# Patient Record
Sex: Female | Born: 1995 | Race: Black or African American | Hispanic: No | Marital: Single | Smoking: Never smoker
Health system: Southern US, Community
[De-identification: ages and names within clinical notes are randomized; demographics above are authoritative.]

## PROBLEM LIST (undated history)

## (undated) DIAGNOSIS — F32A Depression, unspecified: Secondary | ICD-10-CM

## (undated) DIAGNOSIS — F419 Anxiety disorder, unspecified: Secondary | ICD-10-CM

## (undated) DIAGNOSIS — F4310 Post-traumatic stress disorder, unspecified: Secondary | ICD-10-CM

## (undated) HISTORY — PX: TONSILLECTOMY: SUR1361

---
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
## 2014-03-28 ENCOUNTER — Encounter (HOSPITAL_COMMUNITY): Payer: Self-pay | Admitting: Emergency Medicine

## 2014-03-28 ENCOUNTER — Emergency Department (HOSPITAL_COMMUNITY)
Admission: EM | Admit: 2014-03-28 | Discharge: 2014-03-28 | Disposition: A | Discharge status: Home / Self Care | Payer: Self-pay | Attending: Emergency Medicine | Admitting: Emergency Medicine

## 2014-03-28 DIAGNOSIS — Z7952 Long term (current) use of systemic steroids: Secondary | ICD-10-CM | POA: Insufficient documentation

## 2014-03-28 DIAGNOSIS — B349 Viral infection, unspecified: Secondary | ICD-10-CM | POA: Insufficient documentation

## 2014-03-28 LAB — RAPID STREP SCREEN (MED CTR MEBANE ONLY): STREPTOCOCCUS, GROUP A SCREEN (DIRECT): NEGATIVE

## 2014-03-28 MED ORDER — PSEUDOEPHEDRINE HCL 60 MG PO TABS: 60.0000 mg | ORAL_TABLET | Freq: Four times a day (QID) | ORAL | Status: AC | PRN

## 2014-03-28 NOTE — Discharge Instructions (Signed)

## 2014-03-28 NOTE — ED Provider Notes (Signed)
CSN: [id number]     Arrival date & time 03/28/14  1407 History  This chart was scribed for non-physician practitioner, Mariah SilkHannah Manuela Halbur, PA-C,working with Mariah JesterKathleen McManus, DO, by Mariah Bradley, ED Scribe. This patient was seen in room TR08C/TR08C and the patient's care was started at 3:10 PM.  Chief Complaint  Patient presents with  . Sore Throat  . Otalgia   Patient is a 18 y.o. female presenting with pharyngitis and ear pain. [name]. No language interpreter was used.  Sore Throat Pertinent negatives include no abdominal pain.  Otalgia Associated symptoms: sore throat   Associated symptoms: no abdominal pain, no fever and no vomiting    HPI Comments:  Mariah Bradley is a 18 y.o. female who presents to the Emergency Department complaining of bilateral otalgia and a sore throat that began two days ago. She reports associated subjective fever. She states that she has been experiencing nausea and diarrhea about 6 days ago but it has since resolved. Pt has not done anything to treat her symptoms. She denies any alleviating or worsening factors of her symptoms. Denies abdominal pain, vomiting, chills or body aches.  History reviewed. No pertinent past medical history. History reviewed. No pertinent past surgical history. History reviewed. No pertinent family history. History  Substance Use Topics  . Smoking status: Never Smoker   . Smokeless tobacco: Not on file  . Alcohol Use: No   OB History   Grav Para Term Preterm Abortions TAB SAB Ect Mult Living                 Review of Systems  Constitutional: Negative for fever and chills.  HENT: Positive for ear pain and sore throat.   Gastrointestinal: Negative for nausea, vomiting and abdominal pain.  All other systems reviewed and are negative.   Allergies  Calamine  Home Medications   Prior to Admission medications   Medication Sig Start Date End Date Taking? Authorizing Provider   DiphenhydrAMINE HCl (BENADRYL ALLERGY PO) Take by mouth.    Historical Provider, MD  hydrocortisone cream 0.5 % Apply topically 2 (two) times daily.    Historical Provider, MD  hydrOXYzine (ATARAX/VISTARIL) 25 MG tablet Take 1 tablet (25 mg total) by mouth every 6 (six) hours as needed for itching. 11/30/12   Mariah Likesavid C Keller, MD  predniSONE (DELTASONE) 20 MG tablet 3 daily for 5 days, 2 daily for 5 days, 1 daily for 5 days. 11/30/12   Mariah Likesavid C Keller, MD  triamcinolone cream (KENALOG) 0.1 % Apply topically 3 (three) times daily. 11/30/12   Mariah Likesavid C Keller, MD   Triage Vitals: BP 104/74  Pulse 86  Temp(Src) 98.8 F (37.1 C) (Oral)  Resp 18  SpO2 100% Physical Exam  Nursing note and vitals reviewed. Constitutional: She is oriented to person, place, and time. She appears well-developed and well-nourished. No distress.  HENT:  Head: Normocephalic and atraumatic.  Right Ear: Tympanic membrane, external ear and ear canal normal.  Left Ear: Tympanic membrane, external ear and ear canal normal.  Nose: Nose normal.  Mouth/Throat: Uvula is midline, oropharynx is clear and moist and mucous membranes are normal. No oropharyngeal exudate, posterior oropharyngeal edema, posterior oropharyngeal erythema or tonsillar abscesses.  Eyes: Conjunctivae are normal.  Neck: Normal range of motion.  No nuchal rigidity or meningeal signs  Cardiovascular: Normal rate, regular rhythm and normal heart sounds.  Exam reveals no gallop and no friction rub.   No murmur heard. Pulmonary/Chest: Effort normal  and breath sounds normal. No stridor. No respiratory distress. She has no wheezes. She has no rales.  Abdominal: Soft. She exhibits no distension. There is no tenderness.  Musculoskeletal: Normal range of motion.  Neurological: She is alert and oriented to person, place, and time. She has normal strength.  Skin: Skin is warm and dry. She is not diaphoretic. No erythema.  Psychiatric: She has a normal mood and affect.  Her behavior is normal.    ED Course  Procedures (including critical care time) DIAGNOSTIC STUDIES: Oxygen Saturation is 100% on RA, normal by my interpretation.   COORDINATION OF CARE: 3:16 PM- Will wait for rapid strep test to result and will prescribe a decongestant. Pt verbalizes understanding and agrees to plan.  Medications - No data to display  Labs Review Labs Reviewed  RAPID STREP SCREEN  CULTURE, GROUP A STREP    Imaging Review No results found.   EKG Interpretation None      MDM   Final diagnoses:  Viral syndrome   Patients symptoms are consistent with URI, likely viral etiology. Discussed that antibiotics are not indicated for viral infections. Pt will be discharged with symptomatic treatment.  Patient was offered testing for mono, which she declined. Patient verbalizes understanding and is agreeable with plan. Pt is hemodynamically stable & in NAD prior to dc.   I personally performed the services described in this documentation, which was scribed in my presence. The recorded information has been reviewed and is accurate.    Mariah BellmanHannah S Aviance Cooperwood, PA-C 03/28/14 1623

## 2014-03-28 NOTE — ED Notes (Signed)
Pt c/o sore throat, low grade fever, and bilateral ear pain; pt sts had diarrhea earlier this week but now completely resolved

## 2014-03-28 NOTE — ED Provider Notes (Signed)
Medical screening examination/treatment/procedure(s) were performed by non-physician practitioner and as supervising physician I was immediately available for consultation/collaboration.   EKG Interpretation None        Loring Liskey, DO 03/28/14 2034 

## 2014-03-30 LAB — CULTURE, GROUP A STREP

## 2015-02-13 ENCOUNTER — Emergency Department (HOSPITAL_COMMUNITY): Payer: 59

## 2015-02-13 ENCOUNTER — Emergency Department (HOSPITAL_COMMUNITY)
Admission: EM | Admit: 2015-02-13 | Discharge: 2015-02-13 | Disposition: A | Discharge status: Home / Self Care | Payer: 59 | Attending: Emergency Medicine | Admitting: Emergency Medicine

## 2015-02-13 ENCOUNTER — Encounter (HOSPITAL_COMMUNITY): Payer: Self-pay | Admitting: Emergency Medicine

## 2015-02-13 DIAGNOSIS — S60011A Contusion of right thumb without damage to nail, initial encounter: Secondary | ICD-10-CM | POA: Diagnosis not present

## 2015-02-13 DIAGNOSIS — Z79899 Other long term (current) drug therapy: Secondary | ICD-10-CM | POA: Diagnosis not present

## 2015-02-13 DIAGNOSIS — Y998 Other external cause status: Secondary | ICD-10-CM | POA: Insufficient documentation

## 2015-02-13 DIAGNOSIS — Y9389 Activity, other specified: Secondary | ICD-10-CM | POA: Diagnosis not present

## 2015-02-13 DIAGNOSIS — Y9289 Other specified places as the place of occurrence of the external cause: Secondary | ICD-10-CM | POA: Diagnosis not present

## 2015-02-13 DIAGNOSIS — W230XXA Caught, crushed, jammed, or pinched between moving objects, initial encounter: Secondary | ICD-10-CM | POA: Insufficient documentation

## 2015-02-13 DIAGNOSIS — S6991XA Unspecified injury of right wrist, hand and finger(s), initial encounter: Secondary | ICD-10-CM | POA: Diagnosis present

## 2015-02-13 MED ORDER — IBUPROFEN 400 MG PO TABS
600.0000 mg | ORAL_TABLET | Freq: Once | ORAL | Status: AC
  Administered 2015-02-13: 600 mg via ORAL
  Filled 2015-02-13 (×2): qty 1

## 2015-02-13 MED ORDER — IBUPROFEN 600 MG PO TABS: 600.0000 mg | ORAL_TABLET | Freq: Three times a day (TID) | ORAL | Status: DC | PRN

## 2015-02-13 MED ORDER — HYDROCODONE-ACETAMINOPHEN 5-325 MG PO TABS
1.0000 | ORAL_TABLET | Freq: Once | ORAL | Status: AC
  Administered 2015-02-13: 1 via ORAL
  Filled 2015-02-13: qty 1

## 2015-02-13 NOTE — ED Notes (Signed)
Pt. presents with right thumb pain , swelling and bruise injured from a car door this evening .

## 2015-02-13 NOTE — Discharge Instructions (Signed)
Contusion °A contusion is a deep bruise. Contusions are the result of an injury that caused bleeding under the skin. The contusion may turn blue, purple, or yellow. Minor injuries will give you a painless contusion, but more severe contusions may stay painful and swollen for a few weeks.  °CAUSES  °A contusion is usually caused by a blow, trauma, or direct force to an area of the body. °SYMPTOMS  °· Swelling and redness of the injured area. °· Bruising of the injured area. °· Tenderness and soreness of the injured area. °· Pain. °DIAGNOSIS  °The diagnosis can be made by taking a history and physical exam. An X-ray, CT scan, or MRI may be needed to determine if there were any associated injuries, such as fractures. °TREATMENT  °Specific treatment will depend on what area of the body was injured. In general, the best treatment for a contusion is resting, icing, elevating, and applying cold compresses to the injured area. Over-the-counter medicines may also be recommended for pain control. Ask your caregiver what the best treatment is for your contusion. °HOME CARE INSTRUCTIONS  °· Put ice on the injured area. °¨ Put ice in a plastic bag. °¨ Place a towel between your skin and the bag. °¨ Leave the ice on for 15-20 minutes, 3-4 times a day, or as directed by your health care provider. °· Only take over-the-counter or prescription medicines for pain, discomfort, or fever as directed by your caregiver. Your caregiver may recommend avoiding anti-inflammatory medicines (aspirin, ibuprofen, and naproxen) for 48 hours because these medicines may increase bruising. °· Rest the injured area. °· If possible, elevate the injured area to reduce swelling. °SEEK IMMEDIATE MEDICAL CARE IF:  °· You have increased bruising or swelling. °· You have pain that is getting worse. °· Your swelling or pain is not relieved with medicines. °MAKE SURE YOU:  °· Understand these instructions. °· Will watch your condition. °· Will get help right  away if you are not doing well or get worse. °Document Released: 03/09/2005 Document Revised: 06/04/2013 Document Reviewed: 04/04/2011 °[employer]® Patient Information ©2015 [employer]. This information is not intended to replace advice given to you by your health care provider. Make sure you discuss any questions you have with your health care provider. ° °

## 2015-02-13 NOTE — ED Notes (Signed)
Pt verbalized understanding of d/c instructions and has no further questions. Pt stable and NAD. Pt being driven home by friend.

## 2015-02-13 NOTE — ED Provider Notes (Signed)
CSN: [id number]     Arrival date & time 02/13/15  0023 History   This chart was scribed for Azalia Bilis, MD by Arlan Organ, ED Scribe. This patient was seen in room B17C/B17C and the patient's care was started 1:17 AM.   Chief Complaint  Patient presents with  . Finger Injury   [name]. No language interpreter was used.    HPI Comments: Mariah Bradley is a 19 y.o. female without any pertinent past medical history who presents to the Emergency Department here after a R thumb injury sustained approximately 20 minutes prior to arrival. Pt states she slammed her finger in the car door this evening. She now reports constant, ongoing pain and swelling to the finger. Pain is made worse with movement. No alleviating factors at this time. No OTC medications or home remedies attempted prior to arrival. No recent fever, chills, nausea, vomiting, or diarrhea. No numbness, loss of sensation, or weakness,. Denies any previous history of same or injury to finger. Pt with known allergy to Calamine.   History reviewed. No pertinent past medical history. History reviewed. No pertinent past surgical history. No family history on file. Social History  Substance Use Topics  . Smoking status: Never Smoker   . Smokeless tobacco: None  . Alcohol Use: No   OB History    No data available     Review of Systems  Constitutional: Negative for fever and chills.  Respiratory: Negative for cough and shortness of breath.   Cardiovascular: Negative for chest pain.  Gastrointestinal: Negative for nausea, vomiting and abdominal pain.  Musculoskeletal: Positive for arthralgias.  Neurological: Negative for dizziness, weakness and numbness.  Psychiatric/Behavioral: Negative for confusion.  All other systems reviewed and are negative.     Allergies  Calamine  Home Medications   Prior to Admission medications   Medication Sig Start Date End Date Taking? Authorizing Provider   DiphenhydrAMINE HCl (BENADRYL ALLERGY PO) Take by mouth.    Historical Provider, MD  hydrocortisone cream 0.5 % Apply topically 2 (two) times daily.    Historical Provider, MD  hydrOXYzine (ATARAX/VISTARIL) 25 MG tablet Take 1 tablet (25 mg total) by mouth every 6 (six) hours as needed for itching. 11/30/12   Reuben Likes, MD  predniSONE (DELTASONE) 20 MG tablet 3 daily for 5 days, 2 daily for 5 days, 1 daily for 5 days. 11/30/12   Reuben Likes, MD  pseudoephedrine (SUDAFED) 60 MG tablet Take 1 tablet (60 mg total) by mouth every 6 (six) hours as needed for congestion. 03/28/14   Junious Silk, PA-C  triamcinolone cream (KENALOG) 0.1 % Apply topically 3 (three) times daily. 11/30/12   Reuben Likes, MD   Triage Vitals: BP 111/68 mmHg  Pulse 86  Temp(Src) 98.2 F (36.8 C) (Oral)  Resp 16  Ht 5\' 5"  (1.651 m)  Wt 120 lb (54.432 kg)  BMI 19.97 kg/m2  SpO2 100%  LMP 02/10/2015   Physical Exam  Constitutional: She is oriented to person, place, and time. She appears well-developed and well-nourished.  HENT:  Head: Normocephalic.  Eyes: EOM are normal.  Neck: Normal range of motion.  Pulmonary/Chest: Effort normal.  Abdominal: She exhibits no distension.  Musculoskeletal: Normal range of motion. She exhibits tenderness.  Tendeness to distal phalynx of the L thumb Mild bruising noted  Neurological: She is alert and oriented to person, place, and time.  Psychiatric: She has a normal mood and affect.  Nursing note and  vitals reviewed.   ED Course  Procedures (including critical care time)  DIAGNOSTIC STUDIES: Oxygen Saturation is 100% on RA, Normal by my interpretation.    COORDINATION OF CARE: 1:23 AM- Will order DG finger thumb R. Will give Advil and Norco. Discussed treatment plan with pt at bedside and pt agreed to plan.     Labs Review Labs Reviewed - No data to display  Imaging Review Dg Finger Thumb Right  02/13/2015   CLINICAL DATA:  Slammed right thumb in car door  while getting out of car. Initial encounter.  EXAM: RIGHT THUMB 2+V  COMPARISON:  None.  FINDINGS: There is no evidence of fracture or dislocation. The right thumb appears grossly intact. Visualized joint spaces are preserved. No definite soft tissue abnormalities are characterized on radiograph.  IMPRESSION: No evidence of fracture or dislocation.   Electronically Signed   By: Roanna Raider M.D.   On: 02/13/2015 01:26   I have personally reviewed and evaluated these images as part of my medical decision-making.   EKG Interpretation None      MDM   Final diagnoses:  None    Likely contusion.  X-rays negative for fracture.  Home with anti-inflammatories and ice.  I personally performed the services described in this documentation, which was scribed in my presence. The recorded information has been reviewed and is accurate.      Azalia Bilis, MD 02/13/15 [phone]

## 2015-02-20 ENCOUNTER — Emergency Department (HOSPITAL_COMMUNITY): Payer: 59

## 2015-02-20 ENCOUNTER — Emergency Department (HOSPITAL_COMMUNITY)
Admission: EM | Admit: 2015-02-20 | Discharge: 2015-02-20 | Disposition: A | Discharge status: Home / Self Care | Payer: 59 | Attending: Emergency Medicine | Admitting: Emergency Medicine

## 2015-02-20 ENCOUNTER — Encounter (HOSPITAL_COMMUNITY): Payer: Self-pay

## 2015-02-20 DIAGNOSIS — Y9289 Other specified places as the place of occurrence of the external cause: Secondary | ICD-10-CM | POA: Insufficient documentation

## 2015-02-20 DIAGNOSIS — Y9389 Activity, other specified: Secondary | ICD-10-CM | POA: Diagnosis not present

## 2015-02-20 DIAGNOSIS — Z7952 Long term (current) use of systemic steroids: Secondary | ICD-10-CM | POA: Diagnosis not present

## 2015-02-20 DIAGNOSIS — S60221A Contusion of right hand, initial encounter: Secondary | ICD-10-CM

## 2015-02-20 DIAGNOSIS — Y998 Other external cause status: Secondary | ICD-10-CM | POA: Diagnosis not present

## 2015-02-20 DIAGNOSIS — S6991XA Unspecified injury of right wrist, hand and finger(s), initial encounter: Secondary | ICD-10-CM | POA: Diagnosis present

## 2015-02-20 DIAGNOSIS — W2209XA Striking against other stationary object, initial encounter: Secondary | ICD-10-CM | POA: Diagnosis not present

## 2015-02-20 MED ORDER — IBUPROFEN 600 MG PO TABS: 600.0000 mg | ORAL_TABLET | Freq: Four times a day (QID) | ORAL | Status: DC | PRN

## 2015-02-20 MED ORDER — IBUPROFEN 200 MG PO TABS
600.0000 mg | ORAL_TABLET | Freq: Once | ORAL | Status: AC
  Administered 2015-02-20: 600 mg via ORAL
  Filled 2015-02-20: qty 3

## 2015-02-20 NOTE — ED Provider Notes (Signed)
CSN: [id number]     Arrival date & time 02/20/15  0316 History   First MD Initiated Contact with Patient 02/20/15 [phone]     Chief Complaint  Patient presents with  . Hand Injury     (Consider location/radiation/quality/duration/timing/severity/associated sxs/prior Treatment) HPI Comments: Patient states she punched a wall. She was angry and came in because the pain in her hand got worse. Has not taken any medication nor performed any first aid maneuvers for pain relief  Patient is a 19 y.o. female presenting with hand injury. [name].  Hand Injury Location:  Hand Time since incident:  3 hours Injury: yes   Hand location:  R hand Pain details:    Quality:  Aching   Radiates to:  R arm   Severity:  Mild   Onset quality:  Sudden   Duration:  3 hours   Timing:  Constant   Progression:  Unchanged Chronicity:  New Handedness:  Right-handed Dislocation: no   Foreign body present:  No foreign bodies Prior injury to area:  No Relieved by:  None tried Worsened by:  Movement Ineffective treatments:  None tried   History reviewed. No pertinent past medical history. History reviewed. No pertinent past surgical history. History reviewed. No pertinent family history. Social History  Substance Use Topics  . Smoking status: Never Smoker   . Smokeless tobacco: None  . Alcohol Use: No   OB History    No data available     Review of Systems  Musculoskeletal: Negative for joint swelling.  Skin: Positive for color change. Negative for wound.      Allergies  Calamine  Home Medications   Prior to Admission medications   Medication Sig Start Date End Date Taking? Authorizing Provider  DiphenhydrAMINE HCl (BENADRYL ALLERGY PO) Take by mouth.    Historical Provider, MD  hydrocortisone cream 0.5 % Apply topically 2 (two) times daily.    Historical Provider, MD  hydrOXYzine (ATARAX/VISTARIL) 25 MG tablet Take 1 tablet (25 mg total) by mouth every 6  (six) hours as needed for itching. Patient not taking: Reported on 02/20/2015 11/30/12   Reuben Likes, MD  ibuprofen (ADVIL,MOTRIN) 600 MG tablet Take 1 tablet (600 mg total) by mouth every 6 (six) hours as needed. 02/20/15   Earley Favor, NP  predniSONE (DELTASONE) 20 MG tablet 3 daily for 5 days, 2 daily for 5 days, 1 daily for 5 days. Patient not taking: Reported on 02/20/2015 11/30/12   Reuben Likes, MD  pseudoephedrine (SUDAFED) 60 MG tablet Take 1 tablet (60 mg total) by mouth every 6 (six) hours as needed for congestion. Patient not taking: Reported on 02/20/2015 03/28/14   Junious Silk, PA-C  triamcinolone cream (KENALOG) 0.1 % Apply topically 3 (three) times daily. Patient not taking: Reported on 02/20/2015 11/30/12   Reuben Likes, MD   BP 100/56 mmHg  Pulse 87  Temp(Src) 98.1 F (36.7 C) (Oral)  Resp 20  Ht  (1.651 m)  Wt 120 lb (54.432 kg)  BMI 19.97 kg/m2  SpO2 99%  LMP 02/10/2015 Physical Exam  Constitutional: She appears well-developed and well-nourished.  HENT:  Head: Normocephalic.  Eyes: Pupils are equal, round, and reactive to light.  Neck: Normal range of motion.  Cardiovascular: Normal rate and regular rhythm.   Pulmonary/Chest: Effort normal and breath sounds normal.  Musculoskeletal: Normal range of motion. She exhibits tenderness. She exhibits no edema.       Hands: Neurological: She  is alert.  Nursing note and vitals reviewed.   ED Course  Procedures (including critical care time) Labs Review Labs Reviewed - No data to display  Imaging Review Dg Hand Complete Right  02/20/2015   CLINICAL DATA:  Swelling and pain in the 3rd to 4th metacarpal area after patient punched a wall this morning.  EXAM: RIGHT HAND - COMPLETE 3+ VIEW  COMPARISON:  Right first finger 02/13/2015  FINDINGS: There is no evidence of fracture or dislocation. There is no evidence of arthropathy or other focal bone abnormality. Soft tissues are unremarkable.  IMPRESSION: Negative.    Electronically Signed   By: Burman Nieves M.D.   On: 02/20/2015 03:55   I have personally reviewed and evaluated these images and lab results as part of my medical decision-making.   EKG Interpretation None     Patient's x-ray is negative for fracture. She has full range of motion of her fingers. There is some discoloration at the base of the fourth and fifth MCP joints extending into the dorsal aspect of the hand. No break in the skin. Cap refill of third, fourth and fifth finger. Brisk temperature is equal MDM   Final diagnoses:  Hand contusion, right, initial encounter         Earley Favor, NP 02/20/15 0439  Earley Favor, NP 02/20/15 0440  April Palumbo, MD 02/20/15 1610

## 2015-02-20 NOTE — ED Notes (Signed)
Pt injured her hand by hitting a wall, obvious deformity

## 2015-02-20 NOTE — Discharge Instructions (Signed)
°  Cryotherapy Cryotherapy is when you put ice on your injury. Ice helps lessen pain and puffiness (swelling) after an injury. Ice works the best when you start using it in the first 24 to 48 hours after an injury. HOME CARE  Put a dry or damp towel between the ice pack and your skin.  You may press gently on the ice pack.  Leave the ice on for no more than 10 to 20 minutes at a time.  Check your skin after 5 minutes to make sure your skin is okay.  Rest at least 20 minutes between ice pack uses.  Stop using ice when your skin loses feeling (numbness).  Do not use ice on someone who cannot tell you when it hurts. This includes small children and people with memory problems (dementia). GET HELP RIGHT AWAY IF:  You have white spots on your skin.  Your skin turns blue or pale.  Your skin feels waxy or hard.  Your puffiness gets worse. MAKE SURE YOU:   Understand these instructions.  Will watch your condition.  Will get help right away if you are not doing well or get worse. Document Released: 11/16/2007 Document Revised: 08/22/2011 Document Reviewed: 01/20/2011 [facility] Patient Information 2015 [place], [place]. This information is not intended to replace advice given to you by your health care provider. Make sure you discuss any questions you have with your health care provider.  There is no fracture seen on x-ray. Her hand is wrapped in Ace bandage for comfort and given instructions on the use elevation and ice it. A prescription for ibuprofen to take on a regular basis. Follow-up with her primary care physician as needed

## 2015-07-02 ENCOUNTER — Emergency Department (HOSPITAL_COMMUNITY)
Admission: EM | Admit: 2015-07-02 | Discharge: 2015-07-02 | Disposition: A | Discharge status: Home / Self Care | Payer: BLUE CROSS/BLUE SHIELD | Source: Home / Self Care | Attending: Family Medicine | Admitting: Family Medicine

## 2015-07-02 ENCOUNTER — Encounter (HOSPITAL_COMMUNITY): Payer: Self-pay | Admitting: Emergency Medicine

## 2015-07-02 DIAGNOSIS — N949 Unspecified condition associated with female genital organs and menstrual cycle: Secondary | ICD-10-CM

## 2015-07-02 DIAGNOSIS — N921 Excessive and frequent menstruation with irregular cycle: Secondary | ICD-10-CM

## 2015-07-02 DIAGNOSIS — R102 Pelvic and perineal pain: Secondary | ICD-10-CM

## 2015-07-02 LAB — POCT URINALYSIS DIP (DEVICE)
BILIRUBIN URINE: NEGATIVE
GLUCOSE, UA: NEGATIVE mg/dL
Hgb urine dipstick: NEGATIVE
Ketones, ur: NEGATIVE mg/dL
LEUKOCYTES UA: NEGATIVE
NITRITE: NEGATIVE
Protein, ur: NEGATIVE mg/dL
SPECIFIC GRAVITY, URINE: 1.02 (ref 1.005–1.030)
UROBILINOGEN UA: 0.2 mg/dL (ref 0.0–1.0)
pH: 7.5 (ref 5.0–8.0)

## 2015-07-02 LAB — POCT PREGNANCY, URINE: Preg Test, Ur: NEGATIVE

## 2015-07-02 NOTE — ED Notes (Signed)
Patient has no pcp, no gyn.  Patient reports a 2 month history of "feeling in abdomen", sometimes painful.  Denies vaginal discharge, denies urinary symptoms. Last bm was today and normal.  Reports irregular menstrual cycles, not uncommon for her.   Patient is here today because" endometriosis and cancer runs in my family and my mom wanted me to go somewhere to get checked ".   Patient is not having any pain

## 2015-07-02 NOTE — Discharge Instructions (Signed)
Abnormal Uterine Bleeding Abnormal uterine bleeding can affect women at various stages in life, including teenagers, women in their reproductive years, pregnant women, and women who have reached menopause. Several kinds of uterine bleeding are considered abnormal, including:  Bleeding or spotting between periods.   Bleeding after sexual intercourse.   Bleeding that is heavier or more than normal.   Periods that last longer than usual.  Bleeding after menopause.  Many cases of abnormal uterine bleeding are minor and simple to treat, while others are more serious. Any type of abnormal bleeding should be evaluated by your health care provider. Treatment will depend on the cause of the bleeding. HOME CARE INSTRUCTIONS Monitor your condition for any changes. The following actions may help to alleviate any discomfort you are experiencing:  Avoid the use of tampons and douches as directed by your health care provider.  Change your pads frequently. You should get regular pelvic exams and Pap tests. Keep all follow-up appointments for diagnostic tests as directed by your health care provider.  SEEK MEDICAL CARE IF:   Your bleeding lasts more than 1 week.   You feel dizzy at times.  SEEK IMMEDIATE MEDICAL CARE IF:   You pass out.   You are changing pads every 15 to 30 minutes.   You have abdominal pain.  You have a fever.   You become sweaty or weak.   You are passing large blood clots from the vagina.   You start to feel nauseous and vomit. MAKE SURE YOU:   Understand these instructions.  Will watch your condition.  Will get help right away if you are not doing well or get worse.   This information is not intended to replace advice given to you by your health care provider. Make sure you discuss any questions you have with your health care provider.   Document Released: 05/30/2005 Document Revised: 06/04/2013 Document Reviewed: 12/27/2012 Elsevier Interactive  Patient Education 2016 [employer]  Dysfunctional Uterine Bleeding Dysfunctional uterine bleeding is abnormal bleeding from the uterus. Dysfunctional uterine bleeding includes:  A period that comes earlier or later than usual.  A period that is lighter, heavier, or has blood clots.  Bleeding between periods.  Skipping one or more periods.  Bleeding after sexual intercourse.  Bleeding after menopause. HOME CARE INSTRUCTIONS  Pay attention to any changes in your symptoms. Follow these instructions to help with your condition: Eating  Eat well-balanced meals. Include foods that are high in iron, such as liver, meat, shellfish, green leafy vegetables, and eggs.  If you become constipated:  Drink plenty of water.  Eat fruits and vegetables that are high in water and fiber, such as spinach, carrots, raspberries, apples, and mango. Medicines  Take over-the-counter and prescription medicines only as told by your health care provider.  Do not change medicines without talking with your health care provider.  Aspirin or medicines that contain aspirin may make the bleeding worse. Do not take those medicines:  During the week before your period.  During your period.  If you were prescribed iron pills, take them as told by your health care provider. Iron pills help to replace iron that your body loses because of this condition. Activity  If you need to change your sanitary pad or tampon more than one time every 2 hours:  Lie in bed with your feet raised (elevated).  Place a cold pack on your lower abdomen.  Rest as much as possible until the bleeding stops or slows down.  Do not try to lose weight until the bleeding has stopped and your blood iron level is back to normal. Other Instructions  For two months, write down:  When your period starts.  When your period ends.  When any abnormal bleeding occurs.  What problems you notice.  Keep all follow up visits as told  by your health care provider. This is important. SEEK MEDICAL CARE IF:  You get light-headed or weak.  You have nausea and vomiting.  You cannot eat or drink without vomiting.  You feel dizzy or have diarrhea while you are taking medicines.  You are taking birth control pills or hormones, and you want to change them or stop taking them. SEEK IMMEDIATE MEDICAL CARE IF:  You develop a fever or chills.  You need to change your sanitary pad or tampon more than one time per hour.  Your bleeding becomes heavier, or your flow contains clots more often.  You develop pain in your abdomen.  You lose consciousness.  You develop a rash.   This information is not intended to replace advice given to you by your health care provider. Make sure you discuss any questions you have with your health care provider.   Document Released: 05/27/2000 Document Revised: 02/18/2015 Document Reviewed: 08/25/2014 Elsevier Interactive Patient Education 2016 [employer].  Menorrhagia Menorrhagia is the medical term for when your menstrual periods are heavy or last longer than usual. With menorrhagia, every period you have may cause enough blood loss and cramping that you are unable to maintain your usual activities. CAUSES  In some cases, the cause of heavy periods is unknown, but a number of conditions may cause menorrhagia. Common causes include:  A problem with the hormone-producing thyroid gland (hypothyroid).  Noncancerous growths in the uterus (polyps or fibroids).  An imbalance of the estrogen and progesterone hormones.  One of your ovaries not releasing an egg during one or more months.  Side effects of having an intrauterine device (IUD).  Side effects of some medicines, such as anti-inflammatory medicines or blood thinners.  A bleeding disorder that stops your blood from clotting normally. SIGNS AND SYMPTOMS  During a normal period, bleeding lasts between 4 and 8 days. Signs that your  periods are too heavy include:  You routinely have to change your pad or tampon every 1 or 2 hours because it is completely soaked.  You pass blood clots larger than 1 inch (2.5 cm) in size.  You have bleeding for more than 7 days.  You need to use pads and tampons at the same time because of heavy bleeding.  You need to wake up to change your pads or tampons during the night.  You have symptoms of anemia, such as tiredness, fatigue, or shortness of breath. DIAGNOSIS  Your health care provider will perform a physical exam and ask you questions about your symptoms and menstrual history. Other tests may be ordered based on what the health care provider finds during the exam. These tests can include:  Blood tests. Blood tests are used to check if you are pregnant or have hormonal changes, a bleeding or thyroid disorder, low iron levels (anemia), or other problems.  Endometrial biopsy. Your health care provider takes a sample of tissue from the inside of your uterus to be examined under a microscope.  Pelvic ultrasound. This test uses sound waves to make a picture of your uterus, ovaries, and vagina. The pictures can show if you have fibroids or other growths.  Hysteroscopy.  For this test, your health care provider will use a small telescope to look inside your uterus. Based on the results of your initial tests, your health care provider may recommend further testing. TREATMENT  Treatment may not be needed. If it is needed, your health care provider may recommend treatment with one or more medicines first. If these do not reduce bleeding enough, a surgical treatment might be an option. The best treatment for you will depend on:   Whether you need to prevent pregnancy.  Your desire to have children in the future.  The cause and severity of your bleeding.  Your opinion and personal preference.  Medicines for menorrhagia may include:  Birth control methods that use hormones. These  include the pill, skin patch, vaginal ring, shots that you get every 3 months, hormonal IUD, and implant. These treatments reduce bleeding during your menstrual period.  Medicines that thicken blood and slow bleeding.  Medicines that reduce swelling, such as ibuprofen.  Medicines that contain a synthetic hormone called progestin.   Medicines that make the ovaries stop working for a short time.  You may need surgical treatment for menorrhagia if the medicines are unsuccessful. Treatment options include:  Dilation and curettage (D&C). In this procedure, your health care provider opens (dilates) your cervix and then scrapes or suctions tissue from the lining of your uterus to reduce menstrual bleeding.  Operative hysteroscopy. This procedure uses a tiny tube with a light (hysteroscope) to view your uterine cavity and can help in the surgical removal of a polyp that may be causing heavy periods.  Endometrial ablation. Through various techniques, your health care provider permanently destroys the entire lining of your uterus (endometrium). After endometrial ablation, most women have little or no menstrual flow. Endometrial ablation reduces your ability to become pregnant.  Endometrial resection. This surgical procedure uses an electrosurgical wire loop to remove the lining of the uterus. This procedure also reduces your ability to become pregnant.  Hysterectomy. Surgical removal of the uterus and cervix is a permanent procedure that stops menstrual periods. Pregnancy is not possible after a hysterectomy. This procedure requires anesthesia and hospitalization. HOME CARE INSTRUCTIONS   Only take over-the-counter or prescription medicines as directed by your health care provider. Take prescribed medicines exactly as directed. Do not change or switch medicines without consulting your health care provider.  Take any prescribed iron pills exactly as directed by your health care provider. Long-term  heavy bleeding may result in low iron levels. Iron pills help replace the iron your body lost from heavy bleeding. Iron may cause constipation. If this becomes a problem, increase the bran, fruits, and roughage in your diet.  Do not take aspirin or medicines that contain aspirin 1 week before or during your menstrual period. Aspirin may make the bleeding worse.  If you need to change your sanitary pad or tampon more than once every 2 hours, stay in bed and rest as much as possible until the bleeding stops.  Eat well-balanced meals. Eat foods high in iron. Examples are leafy green vegetables, meat, liver, eggs, and whole grain breads and cereals. Do not try to lose weight until the abnormal bleeding has stopped and your blood iron level is back to normal. SEEK MEDICAL CARE IF:   You soak through a pad or tampon every 1 or 2 hours, and this happens every time you have a period.  You need to use pads and tampons at the same time because you are bleeding so much.  You need to change your pad or tampon during the night.  You have a period that lasts for more than 8 days.  You pass clots bigger than 1 inch wide.  You have irregular periods that happen more or less often than once a month.  You feel dizzy or faint.  You feel very weak or tired.  You feel short of breath or feel your heart is beating too fast when you exercise.  You have nausea and vomiting or diarrhea while you are taking your medicine.  You have any problems that may be related to the medicine you are taking. SEEK IMMEDIATE MEDICAL CARE IF:   You soak through 4 or more pads or tampons in 2 hours.  You have any bleeding while you are pregnant. MAKE SURE YOU:   Understand these instructions.  Will watch your condition.  Will get help right away if you are not doing well or get worse.   This information is not intended to replace advice given to you by your health care provider. Make sure you discuss any questions  you have with your health care provider.   Document Released: 05/30/2005 Document Revised: 06/04/2013 Document Reviewed: 11/18/2012 Elsevier Interactive Patient Education [employer]

## 2015-07-02 NOTE — ED Provider Notes (Signed)
CSN: [id number]     Arrival date & time 07/02/15  1617 History   First MD Initiated Contact with Patient 07/02/15 1659     Chief Complaint  Patient presents with  . Endometriosis   (Consider location/radiation/quality/duration/timing/severity/associated sxs/prior Treatment) HPI Comments: 20 year old female complaining of pelvic pain that occurs on a near daily basis. It tends to come and go. She describes cramping. She also states her menses occur irregularly and when they do occur she has a heavy flow. At that time her pelvic cramping is worse. Her last menstrual period was approximately 10 days ago. She is also been having nausea and diarrhea episodically since November. She is currently having none of the symptoms. Denies urinary symptoms. Denies current pelvic or abdominal pain. Denies vaginal bleeding or discharge.   History reviewed. No pertinent past medical history. History reviewed. No pertinent past surgical history. No family history on file. Social History  Substance Use Topics  . Smoking status: Never Smoker   . Smokeless tobacco: None  . Alcohol Use: No   OB History    No data available     Review of Systems  Constitutional: Negative for fever, activity change and fatigue.  HENT: Negative.   Respiratory: Negative.   Cardiovascular: Negative.   Gastrointestinal: Positive for nausea, abdominal pain, diarrhea and constipation.  Genitourinary: Positive for menstrual problem and pelvic pain. Negative for dysuria, frequency and vaginal discharge.  Musculoskeletal: Negative.     Allergies  Calamine  Home Medications   Prior to Admission medications   Medication Sig Start Date End Date Taking? Authorizing Provider  DiphenhydrAMINE HCl (BENADRYL ALLERGY PO) Take by mouth.    Historical Provider, MD  hydrocortisone cream 0.5 % Apply topically 2 (two) times daily.    Historical Provider, MD  hydrOXYzine (ATARAX/VISTARIL) 25 MG tablet Take 1 tablet (25 mg total) by mouth  every 6 (six) hours as needed for itching. Patient not taking: Reported on 02/20/2015 11/30/12   Reuben Likes, MD  ibuprofen (ADVIL,MOTRIN) 600 MG tablet Take 1 tablet (600 mg total) by mouth every 6 (six) hours as needed. 02/20/15   Earley Favor, NP  predniSONE (DELTASONE) 20 MG tablet 3 daily for 5 days, 2 daily for 5 days, 1 daily for 5 days. Patient not taking: Reported on 02/20/2015 11/30/12   Reuben Likes, MD  pseudoephedrine (SUDAFED) 60 MG tablet Take 1 tablet (60 mg total) by mouth every 6 (six) hours as needed for congestion. Patient not taking: Reported on 02/20/2015 03/28/14   Junious Silk, PA-C  triamcinolone cream (KENALOG) 0.1 % Apply topically 3 (three) times daily. Patient not taking: Reported on 02/20/2015 11/30/12   Reuben Likes, MD   Meds Ordered and Administered this Visit  Medications - No data to display  BP 109/70 mmHg  Pulse 58  Temp(Src) 98.5 F (36.9 C) (Oral)  Resp 12  SpO2 100%  LMP 06/19/2015 No data found.   Physical Exam  Constitutional: She appears well-developed and well-nourished. No distress.  Eyes: EOM are normal.  Neck: Normal range of motion. Neck supple.  Cardiovascular: Normal rate, regular rhythm, normal heart sounds and intact distal pulses.   Pulmonary/Chest: Effort normal and breath sounds normal. No respiratory distress. She has no wheezes. She has no rales.  Abdominal: Soft. Bowel sounds are normal. She exhibits no distension and no mass. There is no tenderness. There is no rebound and no guarding.  Musculoskeletal: She exhibits no edema.  Lymphadenopathy:    She has no cervical adenopathy.  Neurological: She is alert. No cranial nerve deficit. She exhibits normal muscle tone.  Skin: Skin is warm and dry. No erythema.  Psychiatric: She has a normal mood and affect.  Nursing note and vitals reviewed.   ED Course  Procedures (including critical care time)  Labs Review Labs Reviewed  POCT URINALYSIS DIP (DEVICE)  POCT PREGNANCY, URINE    Results for orders placed or performed during the hospital encounter of 07/02/15  POCT urinalysis dip (device)  Result Value Ref Range   Glucose, UA NEGATIVE NEGATIVE mg/dL   Bilirubin Urine NEGATIVE NEGATIVE   Ketones, ur NEGATIVE NEGATIVE mg/dL   Specific Gravity, Urine 1.020 1.005 - 1.030   Hgb urine dipstick NEGATIVE NEGATIVE   pH 7.5 5.0 - 8.0   Protein, ur NEGATIVE NEGATIVE mg/dL   Urobilinogen, UA 0.2 0.0 - 1.0 mg/dL   Nitrite NEGATIVE NEGATIVE   Leukocytes, UA NEGATIVE NEGATIVE  Pregnancy, urine POC  Result Value Ref Range   Preg Test, Ur NEGATIVE NEGATIVE     Imaging Review No results found.   Visual Acuity Review  Right Eye Distance:   Left Eye Distance:   Bilateral Distance:    Right Eye Near:   Left Eye Near:    Bilateral Near:         MDM   1. Menorrhagia with irregular cycle   2. Pelvic cramping    Physical exam unremarkable.Pt stable Pt declines pelvic exam.  No current bleeding or pain. Referred to GYN p.1    Hayden Rasmussen, NP 07/02/15 0981

## 2015-08-25 ENCOUNTER — Emergency Department (HOSPITAL_COMMUNITY): Payer: BLUE CROSS/BLUE SHIELD

## 2015-08-25 ENCOUNTER — Emergency Department (HOSPITAL_COMMUNITY)
Admission: EM | Admit: 2015-08-25 | Discharge: 2015-08-25 | Disposition: A | Discharge status: Home / Self Care | Payer: BLUE CROSS/BLUE SHIELD | Attending: Emergency Medicine | Admitting: Emergency Medicine

## 2015-08-25 ENCOUNTER — Encounter (HOSPITAL_COMMUNITY): Payer: Self-pay

## 2015-08-25 DIAGNOSIS — S3992XA Unspecified injury of lower back, initial encounter: Secondary | ICD-10-CM | POA: Insufficient documentation

## 2015-08-25 DIAGNOSIS — S29002A Unspecified injury of muscle and tendon of back wall of thorax, initial encounter: Secondary | ICD-10-CM | POA: Diagnosis present

## 2015-08-25 DIAGNOSIS — S60222A Contusion of left hand, initial encounter: Secondary | ICD-10-CM | POA: Diagnosis not present

## 2015-08-25 DIAGNOSIS — Y92410 Unspecified street and highway as the place of occurrence of the external cause: Secondary | ICD-10-CM | POA: Diagnosis not present

## 2015-08-25 DIAGNOSIS — Y998 Other external cause status: Secondary | ICD-10-CM | POA: Insufficient documentation

## 2015-08-25 DIAGNOSIS — S29012A Strain of muscle and tendon of back wall of thorax, initial encounter: Secondary | ICD-10-CM | POA: Diagnosis not present

## 2015-08-25 DIAGNOSIS — S29019A Strain of muscle and tendon of unspecified wall of thorax, initial encounter: Secondary | ICD-10-CM

## 2015-08-25 DIAGNOSIS — Y9389 Activity, other specified: Secondary | ICD-10-CM | POA: Insufficient documentation

## 2015-08-25 MED ORDER — IBUPROFEN 600 MG PO TABS: 600.0000 mg | ORAL_TABLET | Freq: Four times a day (QID) | ORAL | Status: DC | PRN

## 2015-08-25 MED ORDER — IBUPROFEN 200 MG PO TABS
600.0000 mg | ORAL_TABLET | Freq: Once | ORAL | Status: AC
  Administered 2015-08-25: 600 mg via ORAL
  Filled 2015-08-25: qty 3

## 2015-08-25 MED ORDER — METHOCARBAMOL 500 MG PO TABS
1000.0000 mg | ORAL_TABLET | Freq: Once | ORAL | Status: AC
  Administered 2015-08-25: 1000 mg via ORAL
  Filled 2015-08-25: qty 2

## 2015-08-25 MED ORDER — METHOCARBAMOL 500 MG PO TABS: 1000.0000 mg | ORAL_TABLET | Freq: Three times a day (TID) | ORAL | Status: AC | PRN
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
## 2015-08-25 NOTE — ED Notes (Signed)
Patient also reports back pain at this time. Patient is ambulatory to XR and to lobby.

## 2016-04-19 ENCOUNTER — Encounter (HOSPITAL_COMMUNITY): Payer: Self-pay | Admitting: *Deleted

## 2016-04-19 ENCOUNTER — Emergency Department (HOSPITAL_COMMUNITY)
Admission: EM | Admit: 2016-04-19 | Discharge: 2016-04-19 | Disposition: A | Discharge status: Home / Self Care | Payer: No Typology Code available for payment source | Attending: Emergency Medicine | Admitting: Emergency Medicine

## 2016-04-19 DIAGNOSIS — Y999 Unspecified external cause status: Secondary | ICD-10-CM | POA: Diagnosis not present

## 2016-04-19 DIAGNOSIS — Y939 Activity, unspecified: Secondary | ICD-10-CM | POA: Insufficient documentation

## 2016-04-19 DIAGNOSIS — S39012A Strain of muscle, fascia and tendon of lower back, initial encounter: Secondary | ICD-10-CM | POA: Diagnosis not present

## 2016-04-19 DIAGNOSIS — Z79899 Other long term (current) drug therapy: Secondary | ICD-10-CM | POA: Diagnosis not present

## 2016-04-19 DIAGNOSIS — Y92410 Unspecified street and highway as the place of occurrence of the external cause: Secondary | ICD-10-CM | POA: Insufficient documentation

## 2016-04-19 DIAGNOSIS — S161XXA Strain of muscle, fascia and tendon at neck level, initial encounter: Secondary | ICD-10-CM | POA: Diagnosis not present

## 2016-04-19 DIAGNOSIS — S199XXA Unspecified injury of neck, initial encounter: Secondary | ICD-10-CM | POA: Diagnosis present

## 2016-04-19 MED ORDER — CYCLOBENZAPRINE HCL 10 MG PO TABS: 10.0000 mg | ORAL_TABLET | Freq: Two times a day (BID) | ORAL | 0 refills | Status: AC | PRN

## 2016-04-19 MED ORDER — IBUPROFEN 600 MG PO TABS: 600.0000 mg | ORAL_TABLET | Freq: Four times a day (QID) | ORAL | 0 refills | Status: AC | PRN

## 2016-04-19 MED FILL — CYCLOBENZAPRINE 10 MG TAB: 10 | 10 days supply | Qty: 20 | Fill #0

## 2016-04-19 MED FILL — IBUPROFEN 600 MG TABLET: 600 | 8 days supply | Qty: 30 | Fill #0
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
## 2019-07-12 ENCOUNTER — Emergency Department (HOSPITAL_COMMUNITY)
Admission: EM | Admit: 2019-07-12 | Discharge: 2019-07-12 | Disposition: A | Discharge status: Home / Self Care | Payer: Self-pay | Attending: Emergency Medicine | Admitting: Emergency Medicine

## 2019-07-12 ENCOUNTER — Emergency Department (HOSPITAL_COMMUNITY): Payer: Self-pay

## 2019-07-12 ENCOUNTER — Other Ambulatory Visit: Payer: Self-pay

## 2019-07-12 ENCOUNTER — Encounter (HOSPITAL_COMMUNITY): Payer: Self-pay | Admitting: Emergency Medicine

## 2019-07-12 DIAGNOSIS — F1290 Cannabis use, unspecified, uncomplicated: Secondary | ICD-10-CM | POA: Insufficient documentation

## 2019-07-12 DIAGNOSIS — M545 Low back pain, unspecified: Secondary | ICD-10-CM

## 2019-07-12 DIAGNOSIS — K2920 Alcoholic gastritis without bleeding: Secondary | ICD-10-CM | POA: Insufficient documentation

## 2019-07-12 DIAGNOSIS — Y902 Blood alcohol level of 40-59 mg/100 ml: Secondary | ICD-10-CM | POA: Insufficient documentation

## 2019-07-12 DIAGNOSIS — R109 Unspecified abdominal pain: Secondary | ICD-10-CM | POA: Insufficient documentation

## 2019-07-12 DIAGNOSIS — F1010 Alcohol abuse, uncomplicated: Secondary | ICD-10-CM | POA: Insufficient documentation

## 2019-07-12 HISTORY — DX: Post-traumatic stress disorder, unspecified: F43.10

## 2019-07-12 LAB — I-STAT BETA HCG BLOOD, ED (MC, WL, AP ONLY): I-stat hCG, quantitative: <5 m[IU]/mL (ref <5)

## 2019-07-12 LAB — URINALYSIS, ROUTINE W REFLEX MICROSCOPIC
Bacteria, UA: NONE SEEN
Bilirubin Urine: NEGATIVE
Glucose, UA: NEGATIVE mg/dL
Hgb urine dipstick: NEGATIVE
Ketones, ur: 80 mg/dL — AB
Leukocytes,Ua: NEGATIVE
Nitrite: NEGATIVE
Protein, ur: 30 mg/dL — AB
Specific Gravity, Urine: 1.019 (ref 1.005–1.030)
pH: 5 (ref 5.0–8.0)

## 2019-07-12 LAB — BASIC METABOLIC PANEL
Anion gap: 12 (ref 5–15)
BUN: 11 mg/dL (ref 6–20)
CO2: 19 mmol/L — ABNORMAL LOW (ref 22–32)
Calcium: 8.6 mg/dL — ABNORMAL LOW (ref 8.9–10.3)
Chloride: 108 mmol/L (ref 98–111)
Creatinine, Ser: 0.97 mg/dL (ref 0.44–1.00)
GFR calc Af Amer: >60 mL/min (ref >60)
GFR calc non Af Amer: >60 mL/min (ref >60)
Glucose, Bld: 124 mg/dL — ABNORMAL HIGH (ref 70–99)
Potassium: 4.2 mmol/L (ref 3.5–5.1)
Sodium: 139 mmol/L (ref 135–145)

## 2019-07-12 LAB — RAPID URINE DRUG SCREEN, HOSP PERFORMED
Amphetamines: NOT DETECTED
Barbiturates: NOT DETECTED
Benzodiazepines: NOT DETECTED
Cocaine: NOT DETECTED
Opiates: NOT DETECTED
Tetrahydrocannabinol: POSITIVE — AB

## 2019-07-12 LAB — COMPREHENSIVE METABOLIC PANEL
ALT: 44 U/L (ref 0–44)
AST: 66 U/L — ABNORMAL HIGH (ref 15–41)
Albumin: 5 g/dL (ref 3.5–5.0)
Alkaline Phosphatase: 51 U/L (ref 38–126)
Anion gap: 21 — ABNORMAL HIGH (ref 5–15)
BUN: 13 mg/dL (ref 6–20)
CO2: 15 mmol/L — ABNORMAL LOW (ref 22–32)
Calcium: 10 mg/dL (ref 8.9–10.3)
Chloride: 105 mmol/L (ref 98–111)
Creatinine, Ser: 0.96 mg/dL (ref 0.44–1.00)
GFR calc Af Amer: >60 mL/min (ref >60)
GFR calc non Af Amer: >60 mL/min (ref >60)
Glucose, Bld: 110 mg/dL — ABNORMAL HIGH (ref 70–99)
Potassium: 4.7 mmol/L (ref 3.5–5.1)
Sodium: 141 mmol/L (ref 135–145)
Total Bilirubin: 0.5 mg/dL (ref 0.3–1.2)
Total Protein: 8 g/dL (ref 6.5–8.1)

## 2019-07-12 LAB — CBC
HCT: 39.7 % (ref 36.0–46.0)
Hemoglobin: 13.3 g/dL (ref 12.0–15.0)
MCH: 34.6 pg — ABNORMAL HIGH (ref 26.0–34.0)
MCHC: 33.5 g/dL (ref 30.0–36.0)
MCV: 103.4 fL — ABNORMAL HIGH (ref 80.0–100.0)
Platelets: 297 10*3/uL (ref 150–400)
RBC: 3.84 MIL/uL — ABNORMAL LOW (ref 3.87–5.11)
RDW: 13.2 % (ref 11.5–15.5)
WBC: 11.9 10*3/uL — ABNORMAL HIGH (ref 4.0–10.5)
nRBC: 0 % (ref 0.0–0.2)

## 2019-07-12 LAB — ETHANOL: Alcohol, Ethyl (B): 50 mg/dL — ABNORMAL HIGH (ref <10)

## 2019-07-12 LAB — LIPASE, BLOOD: Lipase: 13 U/L (ref 11–51)

## 2019-07-12 MED ORDER — PANTOPRAZOLE SODIUM 40 MG IV SOLR
40.0000 mg | Freq: Once | INTRAVENOUS | Status: AC
  Administered 2019-07-12: 40 mg via INTRAVENOUS
  Filled 2019-07-12: qty 40

## 2019-07-12 MED ORDER — ONDANSETRON 4 MG PO TBDP
4.0000 mg | ORAL_TABLET | Freq: Once | ORAL | Status: AC | PRN
  Administered 2019-07-12: 4 mg via ORAL
  Filled 2019-07-12: qty 1

## 2019-07-12 MED ORDER — ACETAMINOPHEN 500 MG PO TABS
1000.0000 mg | ORAL_TABLET | Freq: Once | ORAL | Status: AC
  Administered 2019-07-12: 1000 mg via ORAL
  Filled 2019-07-12: qty 2

## 2019-07-12 MED ORDER — FENTANYL CITRATE (PF) 100 MCG/2ML IJ SOLN
50.0000 ug | Freq: Once | INTRAMUSCULAR | Status: AC
  Administered 2019-07-12: 50 ug via INTRAVENOUS
  Filled 2019-07-12: qty 2

## 2019-07-12 MED ORDER — PANTOPRAZOLE SODIUM 20 MG PO TBEC: 20.0000 mg | DELAYED_RELEASE_TABLET | Freq: Every day | ORAL | 0 refills | Status: AC

## 2019-07-12 MED ORDER — PROMETHAZINE HCL 25 MG/ML IJ SOLN
25.0000 mg | Freq: Once | INTRAMUSCULAR | Status: AC
Start: 2019-07-12 — End: 2019-07-12
  Administered 2019-07-12: 25 mg via INTRAVENOUS
  Filled 2019-07-12: qty 1

## 2019-07-12 MED ORDER — SODIUM CHLORIDE 0.9 % IV BOLUS
1000.0000 mL | Freq: Once | INTRAVENOUS | Status: AC
  Administered 2019-07-12: 1000 mL via INTRAVENOUS

## 2019-07-12 MED ORDER — ONDANSETRON 4 MG PO TBDP: ORAL_TABLET | ORAL | 0 refills | Status: DC

## 2019-07-12 MED ORDER — SODIUM CHLORIDE 0.9% FLUSH: 3.0000 mL | Freq: Once | INTRAVENOUS | Status: DC

## 2019-07-12 NOTE — ED Provider Notes (Signed)
[facility] EMERGENCY DEPARTMENT Provider Note   CSN: [id number] Arrival date & time: 07/12/19  1191     History Chief Complaint  Patient presents with  . Emesis    Mariah Bradley is a 24 y.o. female with a hx of PTSD presents to the Emergency Department complaining of gradual, persistent, progressively worsening abdominal cramping, nausea and vomiting onset around 6 PM this evening.  Patient reports she has been drinking most of the day.  She has not had a lot of food to eat.  Patient reports after numerous bouts of nonbloody nonbilious emesis she developed lower back pain.  She reports it is bilateral low back.  She denies falls, known trauma, IV drug use, anticoagulants, recent surgery.  No treatments prior to arrival.  Patient reports she drinks heavily daily.  She reports she almost always drinks whiskey and did not mix different kinds of alcohol tonight.  Additionally she is an everyday marijuana smoker.  She denies other drug usage and cigarette usage.  No specific aggravating or alleviating factors.  She denies a history of hyperemesis.  She denies fever, chills, headache, neck pain, chest pain, shortness of breath, weakness, dizziness, syncope, dysuria, hematuria, loss of bowel or bladder control, numbness or tingling in her legs.  Patient does report that walking makes her back pain worse.  She denies all urinary and vaginal symptoms.   [name] and medical records. No language interpreter was used.       Past Medical History:  Diagnosis Date  . PTSD (post-traumatic stress disorder)     There are no problems to display for this patient.   History reviewed. No pertinent surgical history.   OB History   No obstetric history on file.     No family history on file.  Social History   Tobacco Use  . Smoking status: Never Smoker  . Smokeless tobacco: Never Used  Substance Use Topics  . Alcohol use: Yes  . Drug use: Yes      Types: Marijuana    Home Medications Prior to Admission medications   Medication Sig Start Date End Date Taking? Authorizing Provider  cyclobenzaprine (FLEXERIL) 10 MG tablet Take 1 tablet (10 mg total) by mouth 2 (two) times daily as needed for muscle spasms. 04/19/16   Roxy Horseman, PA-C  hydrOXYzine (ATARAX/VISTARIL) 25 MG tablet Take 1 tablet (25 mg total) by mouth every 6 (six) hours as needed for itching. Patient not taking: Reported on 02/20/2015 11/30/12   Reuben Likes, MD  ibuprofen (ADVIL,MOTRIN) 600 MG tablet Take 1 tablet (600 mg total) by mouth every 6 (six) hours as needed. 04/19/16   Roxy Horseman, PA-C  methocarbamol (ROBAXIN) 500 MG tablet Take 2 tablets (1,000 mg total) by mouth every 8 (eight) hours as needed for muscle spasms. 08/25/15   Loren Racer, MD  ondansetron (ZOFRAN ODT) 4 MG disintegrating tablet 4mg  ODT q4 hours prn nausea/vomit 07/12/19   Jakeira Seeman, 07/14/19, PA-C  pantoprazole (PROTONIX) 20 MG tablet Take 1 tablet (20 mg total) by mouth daily. 07/12/19   Carely Nappier, 07/14/19, PA-C  predniSONE (DELTASONE) 20 MG tablet 3 daily for 5 days, 2 daily for 5 days, 1 daily for 5 days. Patient not taking: Reported on 02/20/2015 11/30/12   12/02/12, MD  pseudoephedrine (SUDAFED) 60 MG tablet Take 1 tablet (60 mg total) by mouth every 6 (six) hours as needed for congestion. Patient not taking: Reported on 02/20/2015 03/28/14   03/30/14,  Sterling Ucci, PA-C  triamcinolone cream (KENALOG) 0.1 % Apply topically 3 (three) times daily. Patient not taking: Reported on 02/20/2015 11/30/12   Harden Mo, MD    Allergies    Calamine  Review of Systems   Review of Systems  Constitutional: Negative for appetite change, diaphoresis, fatigue, fever and unexpected weight change.  HENT: Negative for mouth sores.   Eyes: Negative for visual disturbance.  Respiratory: Negative for cough, chest tightness, shortness of breath and wheezing.   Cardiovascular: Negative for chest  pain.  Gastrointestinal: Positive for abdominal pain, nausea and vomiting. Negative for constipation and diarrhea.  Endocrine: Negative for polydipsia, polyphagia and polyuria.  Genitourinary: Negative for dysuria, frequency, hematuria and urgency.  Musculoskeletal: Positive for back pain. Negative for neck stiffness.  Skin: Negative for rash.  Allergic/Immunologic: Negative for immunocompromised state.  Neurological: Negative for syncope, light-headedness and headaches.  Hematological: Does not bruise/bleed easily.  Psychiatric/Behavioral: Negative for sleep disturbance. The patient is not nervous/anxious.     Physical Exam Updated Vital Signs BP 105/66 (BP Location: Right Arm)   Pulse (!) 125   Temp 98.1 F (36.7 C) (Oral)   Resp 18   SpO2 100%   Physical Exam Vitals and nursing note reviewed.  Constitutional:      General: She is not in acute distress.    Appearance: She is ill-appearing. She is not diaphoretic.     Comments: Actively vomiting nonbloody, nonbilious emesis  HENT:     Head: Normocephalic.  Eyes:     General: No scleral icterus.    Conjunctiva/sclera: Conjunctivae normal.  Cardiovascular:     Rate and Rhythm: Regular rhythm. Tachycardia present.     Pulses: Normal pulses.          Radial pulses are 2+ on the right side and 2+ on the left side.  Pulmonary:     Effort: No tachypnea, accessory muscle usage, prolonged expiration, respiratory distress or retractions.     Breath sounds: No stridor.     Comments: Equal chest rise. No increased work of breathing. Abdominal:     General: There is no distension.     Palpations: Abdomen is soft.     Tenderness: There is abdominal tenderness. There is no right CVA tenderness, left CVA tenderness, guarding or rebound.  Musculoskeletal:     Cervical back: Normal range of motion.     Thoracic back: No deformity, tenderness or bony tenderness.     Lumbar back: Tenderness (Tenderness to palpation of the bilateral  paraspinal muscles) present. No deformity or bony tenderness.     Comments: Moves all extremities equally and without difficulty.  Skin:    General: Skin is warm and dry.     Capillary Refill: Capillary refill takes less than 2 seconds.  Neurological:     Mental Status: She is alert.     GCS: GCS eye subscore is 4. GCS verbal subscore is 5. GCS motor subscore is 6.     Comments: Speech is clear and goal oriented. Sensation intact to normal touch in the bilateral lower extremities.  Strength 5/5 at the hips, knees and ankles.  Psychiatric:        Mood and Affect: Mood normal.     ED Results / Procedures / Treatments   Labs (all labs ordered are listed, but only abnormal results are displayed) Labs Reviewed  COMPREHENSIVE METABOLIC PANEL - Abnormal; Notable for the following components:      Result Value   CO2 15 (*)  Glucose, Bld 110 (*)    AST 66 (*)    Anion gap 21 (*)    All other components within normal limits  CBC - Abnormal; Notable for the following components:   WBC 11.9 (*)    RBC 3.84 (*)    MCV 103.4 (*)    MCH 34.6 (*)    All other components within normal limits  URINALYSIS, ROUTINE W REFLEX MICROSCOPIC - Abnormal; Notable for the following components:   Ketones, ur 80 (*)    Protein, ur 30 (*)    All other components within normal limits  ETHANOL - Abnormal; Notable for the following components:   Alcohol, Ethyl (B) 50 (*)    All other components within normal limits  LIPASE, BLOOD  RAPID URINE DRUG SCREEN, HOSP PERFORMED  BASIC METABOLIC PANEL  I-STAT BETA HCG BLOOD, ED ([facility], [facility], [facility] ONLY)    Radiology DG Lumbar Spine Complete  Result Date: 07/12/2019 CLINICAL DATA:  Atraumatic low back pain today EXAM: LUMBAR SPINE - COMPLETE 4+ VIEW COMPARISON:  10/21/2009 FINDINGS: There is no evidence of lumbar spine fracture. Alignment is normal. Intervertebral disc spaces are maintained. IMPRESSION: Negative. Electronically Signed   By: Marnee Spring M.D.   On:  07/12/2019 06:30    Procedures Procedures (including critical care time)  Medications Ordered in ED Medications  sodium chloride flush (NS) 0.9 % injection 3 mL (3 mLs Intravenous Not Given 07/12/19 0631)  sodium chloride 0.9 % bolus 1,000 mL (has no administration in time range)  acetaminophen (TYLENOL) tablet 1,000 mg (has no administration in time range)  ondansetron (ZOFRAN-ODT) disintegrating tablet 4 mg (4 mg Oral Given 07/12/19 0342)  sodium chloride 0.9 % bolus 1,000 mL (0 mLs Intravenous Stopped 07/12/19 0628)  promethazine (PHENERGAN) injection 25 mg (25 mg Intravenous Given 07/12/19 0449)  fentaNYL (SUBLIMAZE) injection 50 mcg (50 mcg Intravenous Given 07/12/19 0449)  pantoprazole (PROTONIX) injection 40 mg (40 mg Intravenous Given 07/12/19 0449)    ED Course  I have reviewed the triage vital signs and the nursing notes.  Pertinent labs & imaging results that were available during my care of the patient were reviewed by me and considered in my medical decision making (see chart for details).  Clinical Course as of Jul 12 651  Fri Jul 12, 2019  0559 Pt reports she is feeling some better.  Nausea is improved. Tachycardia improving.    Pt continues to c/o low back pain.  Will obtain plain films.   [HM]  [id number] Noted.  Additional fluids ordered.    Ketones, ur(!): 80 [HM]  [id number] Anion gap(!): 21 [HM]    Clinical Course User Index [HM] Myrna Vonseggern, Boyd Kerbs   MDM Rules/Calculators/A&P                      Patient presents with persistent nausea and vomiting onset around 6 PM.  Patient consumed large volume EtOH prior to the onset of the symptoms.  Additionally she was smoking marijuana.  On exam her abdomen is soft with minimal, generalized tenderness.  No focal tenderness, rebound or guarding.  Labs are reassuring.  Mild leukocytosis.  No anemia.  Elevated anion gap.  Additional fluids given, will repeat BMP.  Patient also complaining of low back pain which began after  vomiting.  Appears to be musculoskeletal on exam.  Will obtain plain films.  Patient denies IV drug use, anticoagulants, trauma or other red flags.  6:35AM Plain film without acute abnormality.  Pt  ambulates without difficulty and with steady gait.   6:53 AM At shift change care was transferred to [facility], PA-C who will follow pending studies, re-evaluate, PO trial, ambulate and determine disposition.  Anticipate discharge.   Final Clinical Impression(s) / ED Diagnoses Final diagnoses:  Acute alcoholic gastritis without hemorrhage  Acute bilateral low back pain without sciatica    Rx / DC Orders ED Discharge Orders         Ordered    ondansetron (ZOFRAN ODT) 4 MG disintegrating tablet     07/12/19 0632    pantoprazole (PROTONIX) 20 MG tablet  Daily     07/12/19 0639           Virgil Slinger, Dahlia Client, PA-C 07/12/19 9983    Zadie Rhine, MD 07/15/19 2326

## 2019-07-12 NOTE — ED Provider Notes (Signed)
Care assumed from [facility], please see her note for full details, but in brief Mariah Bradley is a 24 y.o. female who presents with progressively worsening abdominal cramping and persistent nausea and vomiting after drinking for most of the day.  Also complains of back pain after vomiting multiple times.  Lab work shows an anion gap of 21 likely in the setting of ketones, dehydration, and alcohol use, no other significant electrolyte derangements, no significant leukocytosis, lumbar spine films unremarkable.  Patient has received pain medication nausea medication and 2 L of IV fluids, patient is no longer vomiting.  Will repeat BMP, patient will need to be ambulated and p.o. challenged, anticipate discharge.  PLAN: PO challenge and ambulate, follow up repeat BMP, in anion gap improved, can be discharged home with Zofran and Protonix.  BP 121/84 (BP Location: Right Arm)   Pulse (!) 107   Temp 98.1 F (36.7 C) (Oral)   Resp 18   SpO2 100%   ED Course/Procedures   Clinical Course as of Jul 12 807  Fri Jul 12, 2019  0559 Pt reports she is feeling some better.  Nausea is improved. Tachycardia improving.    Pt continues to c/o low back pain.  Will obtain plain films.   [HM]  [id number] Noted.  Additional fluids ordered.    Ketones, ur(!): 80 [HM]  [id number] Anion gap(!): 21 [HM]  0804 Anion gap has improved, now 12, CO2 improved from 15 to 19  Basic metabolic panel(!) [KF]    Clinical Course User Index [HM] Muthersbaugh, Boyd Kerbs [KF] Dartha Lodge, PA-C    Procedures  MDM   Patient able to ambulate in the hallway without difficulty, requesting something for mild headache, Tylenol ordered.  Awaiting p.o. challenge and repeat BMP after second fluid bolus.  8:07 AM Anion gap is closed on repeat BMP, and CO2 improved, tolerating PO food and fluids. Patient states she is feeling much better, and back pain has resolved. Will discharge home with zofran and protonix, encouraged to  decrease alcohol use. Return precautions discussed, pt expresses understanding and agrees with plan.  Final diagnoses:  Acute alcoholic gastritis without hemorrhage  Acute bilateral low back pain without sciatica   ED Discharge Orders         Ordered    ondansetron (ZOFRAN ODT) 4 MG disintegrating tablet     07/12/19 0632    pantoprazole (PROTONIX) 20 MG tablet  Daily     07/12/19 0639                 Dartha Lodge, PA-C 07/12/19 0809    Jacalyn Lefevre, MD 07/12/19 [phone]

## 2019-07-12 NOTE — ED Notes (Signed)
Snack and fluids at bedside. Pt stated stomach is feeling better but has had a consistent headache x3 days.

## 2019-07-12 NOTE — Discharge Instructions (Addendum)
1. Medications: protonix, zofran, usual home medications °2. Treatment: rest, drink plenty of fluids, advance diet slowly °3. Follow Up: Please followup with your primary doctor in 2 days for discussion of your diagnoses and further evaluation after today's visit; if you do not have a primary care doctor use the resource guide provided to find one; Please return to the ER for persistent vomiting, high fevers or worsening symptoms ° °

## 2019-07-12 NOTE — ED Triage Notes (Signed)
Patient reports persistent / multiple emesis after drinking whisky yesterday , denies diarrhea or fever .

## 2020-02-16 ENCOUNTER — Encounter (HOSPITAL_COMMUNITY): Payer: Self-pay | Admitting: Emergency Medicine

## 2020-02-16 ENCOUNTER — Other Ambulatory Visit: Payer: Self-pay

## 2020-02-16 ENCOUNTER — Ambulatory Visit (HOSPITAL_COMMUNITY)
Admission: EM | Admit: 2020-02-16 | Discharge: 2020-02-16 | Disposition: A | Discharge status: Home / Self Care | Payer: HRSA Program | Attending: Family Medicine | Admitting: Family Medicine

## 2020-02-16 DIAGNOSIS — R5383 Other fatigue: Secondary | ICD-10-CM

## 2020-02-16 DIAGNOSIS — R52 Pain, unspecified: Secondary | ICD-10-CM

## 2020-02-16 DIAGNOSIS — R059 Cough, unspecified: Secondary | ICD-10-CM

## 2020-02-16 DIAGNOSIS — U071 COVID-19: Secondary | ICD-10-CM | POA: Insufficient documentation

## 2020-02-16 DIAGNOSIS — Z791 Long term (current) use of non-steroidal anti-inflammatories (NSAID): Secondary | ICD-10-CM | POA: Insufficient documentation

## 2020-02-16 DIAGNOSIS — Z79899 Other long term (current) drug therapy: Secondary | ICD-10-CM | POA: Insufficient documentation

## 2020-02-16 DIAGNOSIS — Z20822 Contact with and (suspected) exposure to covid-19: Secondary | ICD-10-CM

## 2020-02-16 DIAGNOSIS — Z7952 Long term (current) use of systemic steroids: Secondary | ICD-10-CM | POA: Insufficient documentation

## 2020-02-16 DIAGNOSIS — B349 Viral infection, unspecified: Secondary | ICD-10-CM

## 2020-02-16 DIAGNOSIS — R000 Tachycardia, unspecified: Secondary | ICD-10-CM

## 2020-02-16 DIAGNOSIS — R05 Cough: Secondary | ICD-10-CM

## 2020-02-16 DIAGNOSIS — R6883 Chills (without fever): Secondary | ICD-10-CM

## 2020-02-16 DIAGNOSIS — R519 Headache, unspecified: Secondary | ICD-10-CM

## 2020-02-16 DIAGNOSIS — R509 Fever, unspecified: Secondary | ICD-10-CM

## 2020-02-16 DIAGNOSIS — Z1152 Encounter for screening for COVID-19: Secondary | ICD-10-CM

## 2020-02-16 MED ORDER — ACETAMINOPHEN 325 MG PO TABS
650.0000 mg | ORAL_TABLET | Freq: Once | ORAL | Status: AC
  Administered 2020-02-16: 975 mg via ORAL

## 2020-02-16 MED ORDER — ACETAMINOPHEN 325 MG PO TABS
ORAL_TABLET | ORAL | Status: AC
  Filled 2020-02-16: qty 3

## 2020-02-16 NOTE — Discharge Instructions (Signed)
Your COVID test is pending.  You should self quarantine until the test result is back.    Take Tylenol as needed for fever or discomfort.  Rest and keep yourself hydrated.    Go to the emergency department if you develop acute worsening symptoms.     

## 2020-02-16 NOTE — ED Triage Notes (Signed)
Patient reports she has had an exposure to a covid positive person.  Patient started feeling bad yesterday.  Patient has body aches, headache, hot and cold, and a cough, and sore throat

## 2020-02-16 NOTE — ED Provider Notes (Signed)
[facility]   [id number] 02/16/20 Arrival Time: 1306   CC: COVID symptoms  SUBJECTIVE: History from: patient.  Mariah Bradley is a 24 y.o. female who presents with abrupt onset of nasal congestion, PND, fever, fatigue, chills, headache, body aches and persistent dry cough since yesterday.  Reports positive Covid exposure.  Has negative history of Covid.  Has not completed Covid vaccines. Has not taken OTC medications for this. There are no aggravating or alleviating factors. Denies previous symptoms in the past. Denies sinus pain, rhinorrhea, sore throat, SOB, wheezing, chest pain, nausea, changes in bowel or bladder habits.    ROS: As per HPI.  All other pertinent ROS negative.     Past Medical History:  Diagnosis Date  . PTSD (post-traumatic stress disorder)    History reviewed. No pertinent surgical history. Allergies  Allergen Reactions  . Calamine     unsure   No current facility-administered medications on file prior to encounter.   Current Outpatient Medications on File Prior to Encounter  Medication Sig Dispense Refill  . busPIRone (BUSPAR) 10 MG tablet Take 10 mg by mouth 3 (three) times daily.    . cyclobenzaprine (FLEXERIL) 10 MG tablet Take 1 tablet (10 mg total) by mouth 2 (two) times daily as needed for muscle spasms. 20 tablet 0  . hydrOXYzine (ATARAX/VISTARIL) 25 MG tablet Take 1 tablet (25 mg total) by mouth every 6 (six) hours as needed for itching. (Patient not taking: Reported on 02/20/2015) 30 tablet 0  . ibuprofen (ADVIL,MOTRIN) 600 MG tablet Take 1 tablet (600 mg total) by mouth every 6 (six) hours as needed. 30 tablet 0  . methocarbamol (ROBAXIN) 500 MG tablet Take 2 tablets (1,000 mg total) by mouth every 8 (eight) hours as needed for muscle spasms. 30 tablet 0  . ondansetron (ZOFRAN ODT) 4 MG disintegrating tablet 4mg  ODT q4 hours prn nausea/vomit 10 tablet 0  . pantoprazole (PROTONIX) 20 MG tablet Take 1 tablet (20 mg total) by mouth daily. 30  tablet 0  . predniSONE (DELTASONE) 20 MG tablet 3 daily for 5 days, 2 daily for 5 days, 1 daily for 5 days. (Patient not taking: Reported on 02/20/2015) 30 tablet 0  . pseudoephedrine (SUDAFED) 60 MG tablet Take 1 tablet (60 mg total) by mouth every 6 (six) hours as needed for congestion. (Patient not taking: Reported on 02/20/2015) 30 tablet 0  . SEROQUEL 100 MG tablet Take 100 mg by mouth at bedtime.    . triamcinolone cream (KENALOG) 0.1 % Apply topically 3 (three) times daily. (Patient not taking: Reported on 02/20/2015) 454 g 0   Social History   Socioeconomic History  . Marital status: Single    Spouse name: Not on file  . Number of children: Not on file  . Years of education: Not on file  . Highest education level: Not on file  Occupational History  . Not on file  Tobacco Use  . Smoking status: Never Smoker  . Smokeless tobacco: Never Used  Substance and Sexual Activity  . Alcohol use: Yes  . Drug use: Yes    Types: Marijuana  . Sexual activity: Not on file  Other Topics Concern  . Not on file  Social History Narrative  . Not on file   Social Determinants of Health   Financial Resource Strain:   . Difficulty of Paying Living Expenses: Not on file  Food Insecurity:   . Worried About 04/22/2015 in the Last Year: Not on file  .  Ran Out of Food in the Last Year: Not on file  Transportation Needs:   . Lack of Transportation (Medical): Not on file  . Lack of Transportation (Non-Medical): Not on file  Physical Activity:   . Days of Exercise per Week: Not on file  . Minutes of Exercise per Session: Not on file  Stress:   . Feeling of Stress : Not on file  Social Connections:   . Frequency of Communication with Friends and Family: Not on file  . Frequency of Social Gatherings with Friends and Family: Not on file  . Attends Religious Services: Not on file  . Active Member of Clubs or Organizations: Not on file  . Attends Banker Meetings: Not on file  .  Marital Status: Not on file  Intimate Partner Violence:   . Fear of Current or Ex-Partner: Not on file  . Emotionally Abused: Not on file  . Physically Abused: Not on file  . Sexually Abused: Not on file   Family History  Problem Relation Age of Onset  . Stroke Mother   . Diabetes Father     OBJECTIVE:  Vitals:   02/16/20 1526  BP: 113/78  Pulse: (!) 106  Resp: 20  Temp: (!) 102.7 F (39.3 C)  TempSrc: Oral  SpO2: 100%     General appearance: alert; appears fatigued, but nontoxic; speaking in full sentences and tolerating own secretions, febrile HEENT: NCAT; Ears: EACs clear, TMs pearly gray; Eyes: PERRL.  EOM grossly intact. Sinuses: nontender; Nose: nares patent without rhinorrhea, Throat: oropharynx clear, tonsils non erythematous or enlarged, uvula midline  Neck: supple without LAD Lungs: unlabored respirations, symmetrical air entry; cough: absent; no respiratory distress; CTAB Heart: regular rate and rhythm.  Radial pulses 2+ symmetrical bilaterally Skin: warm and dry Psychological: alert and cooperative; normal mood and affect  LABS:  No results found for this or any previous visit (from the past 24 hour(s)).   ASSESSMENT & PLAN:  1. Viral illness   2. Cough   3. Fever, unspecified fever cause   4. Other fatigue   5. Tachycardia   6. Nonintractable headache, unspecified chronicity pattern, unspecified headache type   7. Chills   8. Aches   9. Exposure to COVID-19 virus   10. Encounter for screening for COVID-19     Meds ordered this encounter  Medications  . acetaminophen (TYLENOL) tablet 650 mg    Tylenol given in office for fever Continue supportive care at home Work note provided COVID testing ordered.  It will take between 1-2 days for test results.  Someone will contact you regarding abnormal results.    Patient should remain in quarantine until they have received Covid results.  If negative you may resume normal activities (go back to  work/school) while practicing hand hygiene, social distance, and mask wearing.  If positive, patient should remain in quarantine for 10 days from symptom onset AND greater than 72 hours after symptoms resolution (absence of fever without the use of fever-reducing medication and improvement in respiratory symptoms), whichever is longer Get plenty of rest and push fluids Use OTC zyrtec for nasal congestion, runny nose, and/or sore throat Use OTC flonase for nasal congestion and runny nose Use medications daily for symptom relief Use OTC medications like ibuprofen or tylenol as needed fever or pain Call or go to the ED if you have any new or worsening symptoms such as fever, worsening cough, shortness of breath, chest tightness, chest pain, turning blue, changes in  mental status.  Reviewed expectations re: course of current medical issues. Questions answered. Outlined signs and symptoms indicating need for more acute intervention. Patient verbalized understanding. After Visit Summary given.         Moshe Cipro, NP 02/16/20 [phone]

## 2020-02-17 LAB — SARS CORONAVIRUS 2 (TAT 6-24 HRS): SARS Coronavirus 2: POSITIVE — AB

## 2020-02-18 ENCOUNTER — Other Ambulatory Visit: Payer: Self-pay | Admitting: Nurse Practitioner

## 2020-02-18 NOTE — Progress Notes (Signed)
Symptoms: HA, BA, cough, fever, chills, PND, Congestion, Rhinorrhea Symptom Onset: 2days (per chart review) Qualifiers: AA  Unable to reach patient by telephone - voicemail full. SMS message sent with COVID hotline number.

## 2020-02-28 ENCOUNTER — Other Ambulatory Visit: Payer: Self-pay

## 2020-02-28 DIAGNOSIS — Z20822 Contact with and (suspected) exposure to covid-19: Secondary | ICD-10-CM

## 2020-03-02 LAB — NOVEL CORONAVIRUS, NAA: SARS-CoV-2, NAA: NOT DETECTED

## 2020-06-25 ENCOUNTER — Other Ambulatory Visit: Payer: Self-pay

## 2020-06-25 ENCOUNTER — Emergency Department (HOSPITAL_COMMUNITY)
Admission: EM | Admit: 2020-06-25 | Discharge: 2020-06-25 | Disposition: A | Discharge status: Home / Self Care | Payer: Self-pay | Attending: Emergency Medicine | Admitting: Emergency Medicine

## 2020-06-25 ENCOUNTER — Encounter (HOSPITAL_COMMUNITY): Payer: Self-pay

## 2020-06-25 DIAGNOSIS — Z20822 Contact with and (suspected) exposure to covid-19: Secondary | ICD-10-CM | POA: Insufficient documentation

## 2020-06-25 DIAGNOSIS — R531 Weakness: Secondary | ICD-10-CM | POA: Insufficient documentation

## 2020-06-25 DIAGNOSIS — R112 Nausea with vomiting, unspecified: Secondary | ICD-10-CM | POA: Insufficient documentation

## 2020-06-25 HISTORY — DX: Depression, unspecified: F32.A

## 2020-06-25 HISTORY — DX: Anxiety disorder, unspecified: F41.9

## 2020-06-25 LAB — URINALYSIS, ROUTINE W REFLEX MICROSCOPIC
Bacteria, UA: NONE SEEN
Bilirubin Urine: NEGATIVE
Glucose, UA: NEGATIVE mg/dL
Hgb urine dipstick: NEGATIVE
Ketones, ur: 80 mg/dL — AB
Leukocytes,Ua: NEGATIVE
Nitrite: NEGATIVE
Protein, ur: 100 mg/dL — AB
Specific Gravity, Urine: 1.03 (ref 1.005–1.030)
pH: 5 (ref 5.0–8.0)

## 2020-06-25 LAB — CBC
HCT: 38.1 % (ref 36.0–46.0)
Hemoglobin: 12.8 g/dL (ref 12.0–15.0)
MCH: 34.7 pg — ABNORMAL HIGH (ref 26.0–34.0)
MCHC: 33.6 g/dL (ref 30.0–36.0)
MCV: 103.3 fL — ABNORMAL HIGH (ref 80.0–100.0)
Platelets: 275 10*3/uL (ref 150–400)
RBC: 3.69 MIL/uL — ABNORMAL LOW (ref 3.87–5.11)
RDW: 12.5 % (ref 11.5–15.5)
WBC: 12.3 10*3/uL — ABNORMAL HIGH (ref 4.0–10.5)
nRBC: 0 % (ref 0.0–0.2)

## 2020-06-25 LAB — COMPREHENSIVE METABOLIC PANEL
ALT: 49 U/L — ABNORMAL HIGH (ref 0–44)
AST: 88 U/L — ABNORMAL HIGH (ref 15–41)
Albumin: 5.5 g/dL — ABNORMAL HIGH (ref 3.5–5.0)
Alkaline Phosphatase: 58 U/L (ref 38–126)
Anion gap: 14 (ref 5–15)
BUN: 17 mg/dL (ref 6–20)
CO2: 23 mmol/L (ref 22–32)
Calcium: 9.4 mg/dL (ref 8.9–10.3)
Chloride: 101 mmol/L (ref 98–111)
Creatinine, Ser: 0.95 mg/dL (ref 0.44–1.00)
GFR, Estimated: >60 mL/min (ref >60)
Glucose, Bld: 100 mg/dL — ABNORMAL HIGH (ref 70–99)
Potassium: 4.1 mmol/L (ref 3.5–5.1)
Sodium: 138 mmol/L (ref 135–145)
Total Bilirubin: 1.1 mg/dL (ref 0.3–1.2)
Total Protein: 8.3 g/dL — ABNORMAL HIGH (ref 6.5–8.1)

## 2020-06-25 LAB — LIPASE, BLOOD: Lipase: 16 U/L (ref 11–51)

## 2020-06-25 LAB — I-STAT BETA HCG BLOOD, ED (MC, WL, AP ONLY): I-stat hCG, quantitative: <5 m[IU]/mL (ref <5)

## 2020-06-25 MED ORDER — ONDANSETRON HCL 4 MG PO TABS: 4.0000 mg | ORAL_TABLET | Freq: Four times a day (QID) | ORAL | 0 refills | Status: AC

## 2020-06-25 MED ORDER — SODIUM CHLORIDE 0.9 % IV BOLUS
1000.0000 mL | Freq: Once | INTRAVENOUS | Status: AC
  Administered 2020-06-25: 1000 mL via INTRAVENOUS

## 2020-06-25 MED ORDER — ONDANSETRON HCL 4 MG/2ML IJ SOLN
4.0000 mg | Freq: Once | INTRAMUSCULAR | Status: AC
  Administered 2020-06-25: 4 mg via INTRAVENOUS
  Filled 2020-06-25: qty 2

## 2020-06-25 NOTE — ED Provider Notes (Signed)
[facility]-EMERGENCY DEPT Provider Note   CSN: [id number] Arrival date & time: 06/25/20  1800     History Chief Complaint  Patient presents with  . Emesis  . Weakness  . Abdominal Pain    Mariah Bradley is a 25 y.o. female.  25 year old female with prior medical history of detail below presents for evaluation.  Patient reports onset of nausea and vomiting approximately 20 hours previously.  Patient reports multiple episodes of emesis.  Patient denies diarrhea.  She denies abdominal pain.  She denies fever.  She reports multiple sick contacts at work with similar symptoms.  She did not take anything at home for her symptoms.  She denies chest pain.  She denies back pain.    [name] and medical records.  Emesis Severity:  Moderate Duration:  1 day Timing:  Constant Quality:  Stomach contents Chronicity:  New Relieved by:  Nothing Worsened by:  Nothing Associated symptoms: no abdominal pain and no fever   Weakness Associated symptoms: vomiting   Associated symptoms: no abdominal pain and no fever   Abdominal Pain Associated symptoms: vomiting   Associated symptoms: no fever        Past Medical History:  Diagnosis Date  . Anxiety   . Depression   . PTSD (post-traumatic stress disorder)     There are no problems to display for this patient.   Past Surgical History:  Procedure Laterality Date  . TONSILLECTOMY       OB History   No obstetric history on file.     Family History  Problem Relation Age of Onset  . Stroke Mother   . Diabetes Father     Social History   Tobacco Use  . Smoking status: Never Smoker  . Smokeless tobacco: Never Used  Vaping Use  . Vaping Use: Never used  Substance Use Topics  . Alcohol use: Yes  . Drug use: Yes    Types: Marijuana    Home Medications Prior to Admission medications   Medication Sig Start Date End Date Taking? Authorizing Provider  ondansetron  (ZOFRAN) 4 MG tablet Take 1 tablet (4 mg total) by mouth every 6 (six) hours. 06/25/20  Yes Wynetta Fines, MD  busPIRone (BUSPAR) 10 MG tablet Take 10 mg by mouth 3 (three) times daily. 10/16/19   [provider]  cyclobenzaprine (FLEXERIL) 10 MG tablet Take 1 tablet (10 mg total) by mouth 2 (two) times daily as needed for muscle spasms. 04/19/16   Roxy Horseman, PA-C  hydrOXYzine (ATARAX/VISTARIL) 25 MG tablet Take 1 tablet (25 mg total) by mouth every 6 (six) hours as needed for itching. Patient not taking: Reported on 02/20/2015 11/30/12   Reuben Likes, MD  ibuprofen (ADVIL,MOTRIN) 600 MG tablet Take 1 tablet (600 mg total) by mouth every 6 (six) hours as needed. 04/19/16   Roxy Horseman, PA-C  methocarbamol (ROBAXIN) 500 MG tablet Take 2 tablets (1,000 mg total) by mouth every 8 (eight) hours as needed for muscle spasms. 08/25/15   Loren Racer, MD  ondansetron (ZOFRAN ODT) 4 MG disintegrating tablet 4mg  ODT q4 hours prn nausea/vomit 07/12/19   Muthersbaugh, 07/14/19, PA-C  pantoprazole (PROTONIX) 20 MG tablet Take 1 tablet (20 mg total) by mouth daily. 07/12/19   Muthersbaugh, 07/14/19, PA-C  predniSONE (DELTASONE) 20 MG tablet 3 daily for 5 days, 2 daily for 5 days, 1 daily for 5 days. Patient not taking: Reported on 02/20/2015 11/30/12   12/02/12,  Dineen Kid, MD  pseudoephedrine (SUDAFED) 60 MG tablet Take 1 tablet (60 mg total) by mouth every 6 (six) hours as needed for congestion. Patient not taking: Reported on 02/20/2015 03/28/14   Junious Silk, PA-C  SEROQUEL 100 MG tablet Take 100 mg by mouth at bedtime. 10/16/19   [provider]  triamcinolone cream (KENALOG) 0.1 % Apply topically 3 (three) times daily. Patient not taking: Reported on 02/20/2015 11/30/12   Reuben Likes, MD    Allergies    Calamine  Review of Systems   Review of Systems  Constitutional: Negative for fever.  Gastrointestinal: Positive for vomiting. Negative for abdominal pain.  Neurological: Positive  for weakness.  All other systems reviewed and are negative.   Physical Exam Updated Vital Signs BP 120/78   Pulse 70   Temp 98.2 F (36.8 C) (Oral)   Resp 18   Ht 5\' 5"  (1.651 m)   Wt 49.9 kg   LMP 06/20/2020   SpO2 100%   BMI 18.30 kg/m   Physical Exam Vitals and nursing note reviewed.  Constitutional:      General: She is not in acute distress.    Appearance: She is well-developed and well-nourished.  HENT:     Head: Normocephalic and atraumatic.     Mouth/Throat:     Mouth: Oropharynx is clear and moist.  Eyes:     Extraocular Movements: EOM normal.     Conjunctiva/sclera: Conjunctivae normal.     Pupils: Pupils are equal, round, and reactive to light.  Cardiovascular:     Rate and Rhythm: Normal rate and regular rhythm.     Heart sounds: Normal heart sounds.  Pulmonary:     Effort: Pulmonary effort is normal. No respiratory distress.     Breath sounds: Normal breath sounds.  Abdominal:     General: There is no distension.     Palpations: Abdomen is soft.     Tenderness: There is no abdominal tenderness.  Musculoskeletal:        General: No deformity or edema. Normal range of motion.     Cervical back: Normal range of motion and neck supple.  Skin:    General: Skin is warm and dry.  Neurological:     Mental Status: She is alert and oriented to person, place, and time.  Psychiatric:        Mood and Affect: Mood and affect normal.     ED Results / Procedures / Treatments   Labs (all labs ordered are listed, but only abnormal results are displayed) Labs Reviewed  COMPREHENSIVE METABOLIC PANEL - Abnormal; Notable for the following components:      Result Value   Glucose, Bld 100 (*)    Total Protein 8.3 (*)    Albumin 5.5 (*)    AST 88 (*)    ALT 49 (*)    All other components within normal limits  CBC - Abnormal; Notable for the following components:   WBC 12.3 (*)    RBC 3.69 (*)    MCV 103.3 (*)    MCH 34.7 (*)    All other components within  normal limits  URINALYSIS, ROUTINE W REFLEX MICROSCOPIC - Abnormal; Notable for the following components:   APPearance HAZY (*)    Ketones, ur 80 (*)    Protein, ur 100 (*)    All other components within normal limits  SARS CORONAVIRUS 2 (TAT 6-24 HRS)  LIPASE, BLOOD  I-STAT BETA HCG BLOOD, ED ([facility], [facility], [facility] ONLY)  EKG None  Radiology No results found.  Procedures Procedures (including critical care time)  Medications Ordered in ED Medications  sodium chloride 0.9 % bolus 1,000 mL (1,000 mLs Intravenous New Bag/Given (Non-Interop) 06/25/20 2142)  ondansetron (ZOFRAN) injection 4 mg (4 mg Intravenous Given 06/25/20 2143)    ED Course  I have reviewed the triage vital signs and the nursing notes.  Pertinent labs & imaging results that were available during my care of the patient were reviewed by me and considered in my medical decision making (see chart for details).    MDM Rules/Calculators/A&P                          MDM  Screen complete  Mariah Bradley was evaluated in Emergency Department on 06/25/2020 for the symptoms described in the history of present illness. She was evaluated in the context of the global COVID-19 pandemic, which necessitated consideration that the patient might be at risk for infection with the SARS-CoV-2 virus that causes COVID-19. Institutional protocols and algorithms that pertain to the evaluation of patients at risk for COVID-19 are in a state of rapid change based on information released by regulatory bodies including the CDC and federal and state organizations. These policies and algorithms were followed during the patient's care in the ED.  Patient is presenting for evaluation of reported nausea and vomiting.  Patient's complaints are suggestive of likely viral process.   Screening labs obtained are without a significant acute pathology.  COVID test is pending.  Patient understands how to follow-up on these results through MyChart.  With IV  fluids and antiemetics, patient feels significant proved.  She is now taking good p.o.  She desires discharge.  Patient understands need for close follow-up.  Strict return precautions given and understood.   Final Clinical Impression(s) / ED Diagnoses Final diagnoses:  Non-intractable vomiting with nausea, unspecified vomiting type    Rx / DC Orders ED Discharge Orders         Ordered    ondansetron (ZOFRAN) 4 MG tablet  Every 6 hours        06/25/20 2240           Wynetta Fines, MD 06/25/20 2256

## 2020-06-25 NOTE — ED Triage Notes (Addendum)
Patient c/o weakness, emesis, and mid abdominal pain. Patient states she "thought I saw blood when I vomited."  Patient added that she has had Covid exposure at her job and has a pending covid test that she did yesterday.

## 2020-06-25 NOTE — Discharge Instructions (Signed)
Please return for any problem.  °

## 2020-06-26 LAB — SARS CORONAVIRUS 2 (TAT 6-24 HRS): SARS Coronavirus 2: NEGATIVE

## 2021-11-01 IMAGING — DX DG LUMBAR SPINE COMPLETE 4+V
5 series · 5 of 5 positions shown · non-contrast
Comparison: 10/21/2009

CLINICAL DATA: Atraumatic low back pain today

EXAM:
LUMBAR SPINE - COMPLETE 4+ VIEW

[l-spine ap]
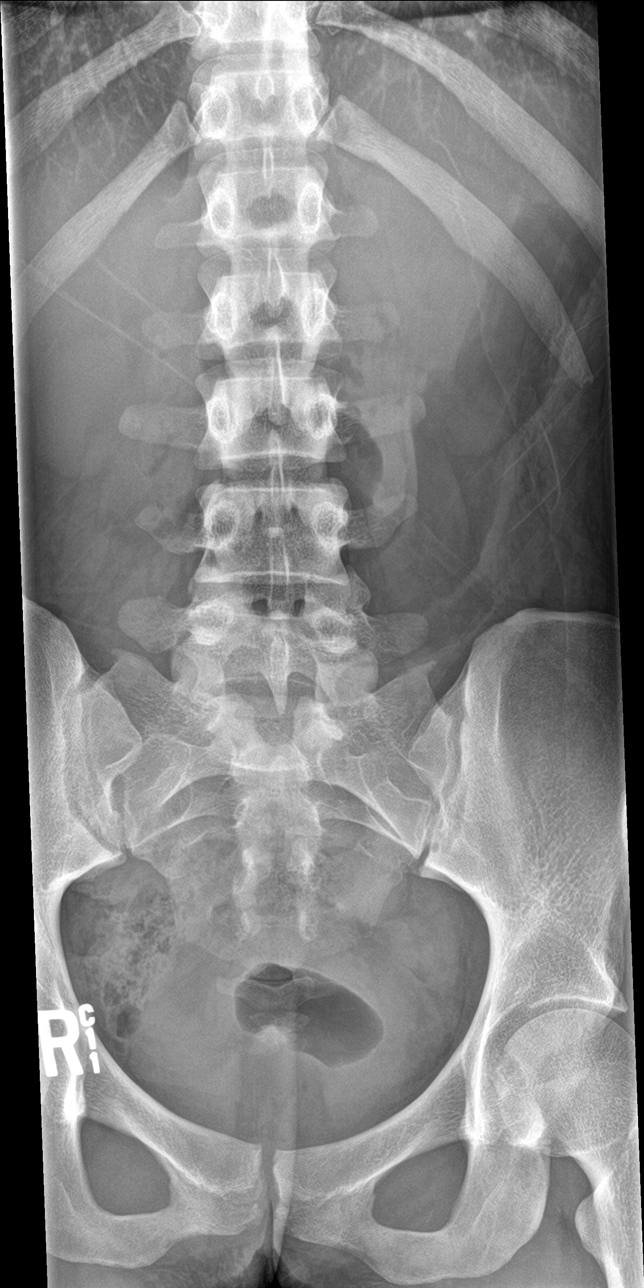

[l-spine obl (1 of 2)]
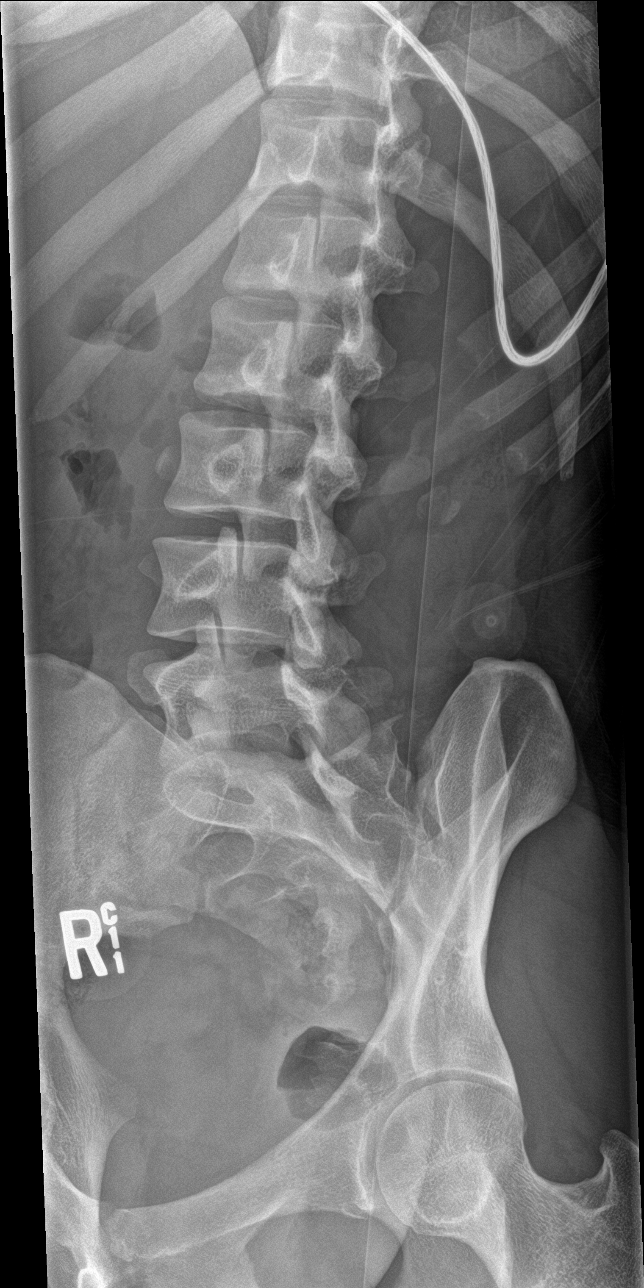

[l-spine obl (2 of 2)]
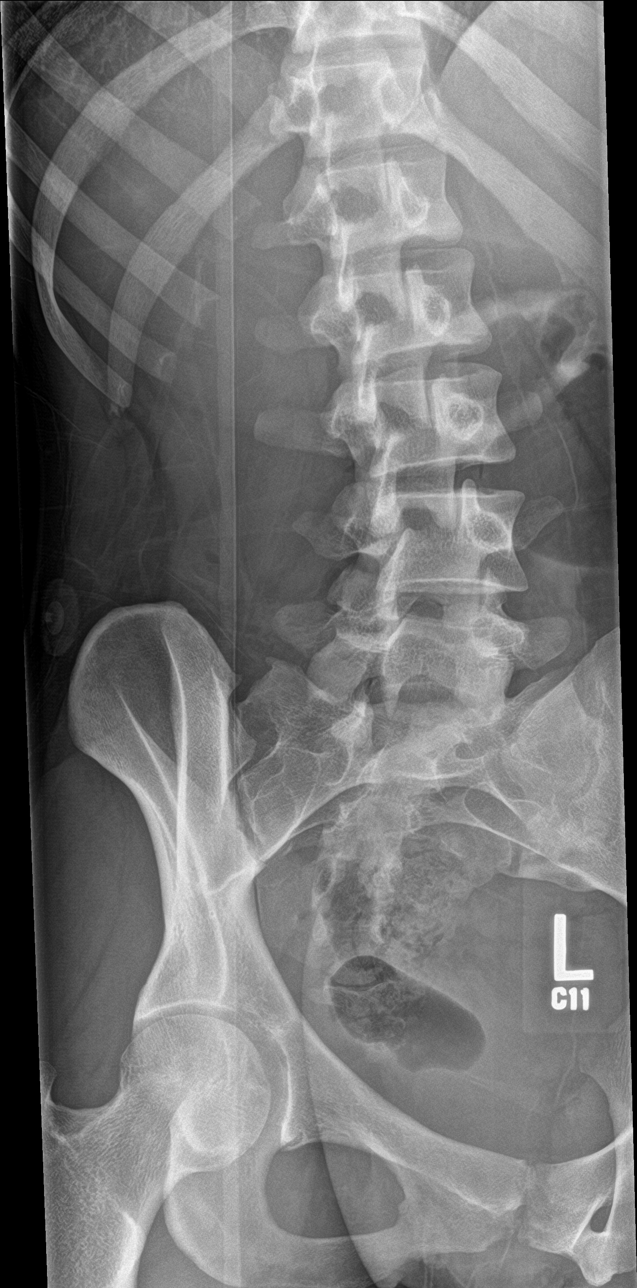

[l-spine lat]
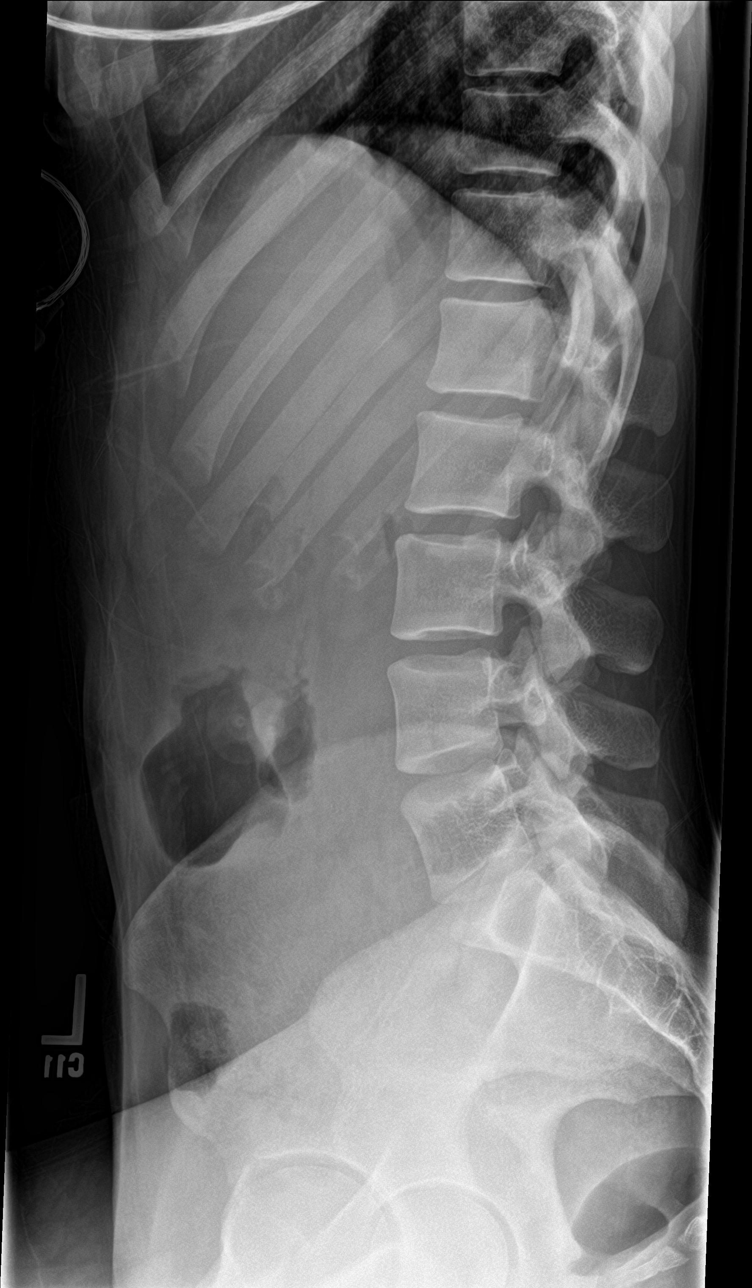

[l-spine spot]
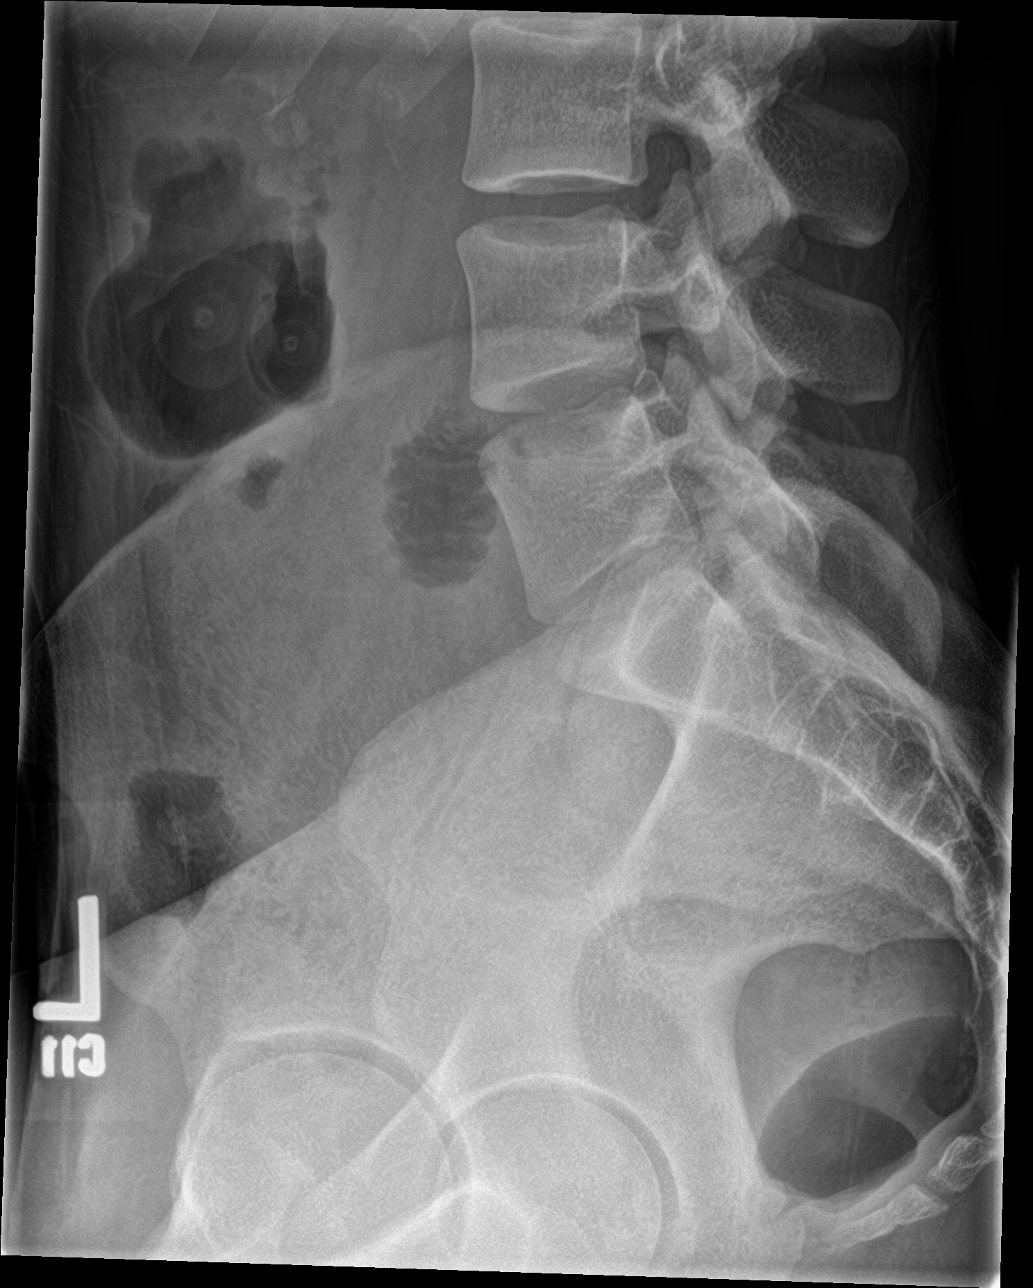

[5 of 5 positions shown; findings below may reference images not displayed]

FINDINGS: There is no evidence of lumbar spine fracture. Alignment is normal.
Intervertebral disc spaces are maintained.
IMPRESSION: Negative.

## 2021-12-09 ENCOUNTER — Emergency Department (HOSPITAL_COMMUNITY): Payer: BC Managed Care – PPO

## 2021-12-09 ENCOUNTER — Encounter (HOSPITAL_COMMUNITY): Payer: Self-pay | Admitting: Emergency Medicine

## 2021-12-09 ENCOUNTER — Emergency Department (HOSPITAL_COMMUNITY)
Admission: EM | Admit: 2021-12-09 | Discharge: 2021-12-09 | Disposition: A | Discharge status: Home / Self Care | Payer: BC Managed Care – PPO | Attending: Emergency Medicine | Admitting: Emergency Medicine

## 2021-12-09 ENCOUNTER — Other Ambulatory Visit: Payer: Self-pay

## 2021-12-09 DIAGNOSIS — K852 Alcohol induced acute pancreatitis without necrosis or infection: Secondary | ICD-10-CM | POA: Insufficient documentation

## 2021-12-09 DIAGNOSIS — R1013 Epigastric pain: Secondary | ICD-10-CM | POA: Diagnosis not present

## 2021-12-09 DIAGNOSIS — Y900 Blood alcohol level of less than 20 mg/100 ml: Secondary | ICD-10-CM | POA: Diagnosis not present

## 2021-12-09 DIAGNOSIS — R109 Unspecified abdominal pain: Secondary | ICD-10-CM | POA: Diagnosis not present

## 2021-12-09 DIAGNOSIS — R112 Nausea with vomiting, unspecified: Secondary | ICD-10-CM | POA: Diagnosis not present

## 2021-12-09 DIAGNOSIS — R001 Bradycardia, unspecified: Secondary | ICD-10-CM | POA: Diagnosis not present

## 2021-12-09 DIAGNOSIS — R7401 Elevation of levels of liver transaminase levels: Secondary | ICD-10-CM | POA: Diagnosis not present

## 2021-12-09 DIAGNOSIS — R1011 Right upper quadrant pain: Secondary | ICD-10-CM | POA: Diagnosis not present

## 2021-12-09 DIAGNOSIS — K760 Fatty (change of) liver, not elsewhere classified: Secondary | ICD-10-CM | POA: Diagnosis not present

## 2021-12-09 LAB — I-STAT VENOUS BLOOD GAS, ED
Acid-base deficit: 12 mmol/L — ABNORMAL HIGH (ref 0.0–2.0)
Bicarbonate: 13.6 mmol/L — ABNORMAL LOW (ref 20.0–28.0)
Calcium, Ion: 1.13 mmol/L — ABNORMAL LOW (ref 1.15–1.40)
HCT: 41 % (ref 36.0–46.0)
Hemoglobin: 13.9 g/dL (ref 12.0–15.0)
O2 Saturation: 63 %
Potassium: 4.8 mmol/L (ref 3.5–5.1)
Sodium: 134 mmol/L — ABNORMAL LOW (ref 135–145)
TCO2: 14 mmol/L — ABNORMAL LOW (ref 22–32)
pCO2, Ven: 29.3 mmHg — ABNORMAL LOW (ref 44–60)
pH, Ven: 7.275 (ref 7.25–7.43)
pO2, Ven: 36 mmHg (ref 32–45)

## 2021-12-09 LAB — URINALYSIS, ROUTINE W REFLEX MICROSCOPIC
Bacteria, UA: NONE SEEN
Bilirubin Urine: NEGATIVE
Glucose, UA: NEGATIVE mg/dL
Hgb urine dipstick: NEGATIVE
Ketones, ur: 80 mg/dL — AB
Leukocytes,Ua: NEGATIVE
Nitrite: NEGATIVE
Protein, ur: 30 mg/dL — AB
Specific Gravity, Urine: 1.019 (ref 1.005–1.030)
pH: 6 (ref 5.0–8.0)

## 2021-12-09 LAB — SALICYLATE LEVEL: Salicylate Lvl: <7 mg/dL — ABNORMAL LOW (ref 7.0–30.0)

## 2021-12-09 LAB — COMPREHENSIVE METABOLIC PANEL
ALT: 105 U/L — ABNORMAL HIGH (ref 0–44)
AST: 122 U/L — ABNORMAL HIGH (ref 15–41)
Albumin: 5 g/dL (ref 3.5–5.0)
Alkaline Phosphatase: 42 U/L (ref 38–126)
Anion gap: 21 — ABNORMAL HIGH (ref 5–15)
BUN: 8 mg/dL (ref 6–20)
CO2: 16 mmol/L — ABNORMAL LOW (ref 22–32)
Calcium: 9.8 mg/dL (ref 8.9–10.3)
Chloride: 100 mmol/L (ref 98–111)
Creatinine, Ser: 1 mg/dL (ref 0.44–1.00)
GFR, Estimated: >60 mL/min (ref >60)
Glucose, Bld: 103 mg/dL — ABNORMAL HIGH (ref 70–99)
Potassium: 4.4 mmol/L (ref 3.5–5.1)
Sodium: 137 mmol/L (ref 135–145)
Total Bilirubin: 1.4 mg/dL — ABNORMAL HIGH (ref 0.3–1.2)
Total Protein: 7.7 g/dL (ref 6.5–8.1)

## 2021-12-09 LAB — CBC
HCT: 38.9 % (ref 36.0–46.0)
Hemoglobin: 12.9 g/dL (ref 12.0–15.0)
MCH: 35.5 pg — ABNORMAL HIGH (ref 26.0–34.0)
MCHC: 33.2 g/dL (ref 30.0–36.0)
MCV: 107.2 fL — ABNORMAL HIGH (ref 80.0–100.0)
Platelets: 262 10*3/uL (ref 150–400)
RBC: 3.63 MIL/uL — ABNORMAL LOW (ref 3.87–5.11)
RDW: 13 % (ref 11.5–15.5)
WBC: 6.2 10*3/uL (ref 4.0–10.5)
nRBC: 0 % (ref 0.0–0.2)

## 2021-12-09 LAB — ETHANOL: Alcohol, Ethyl (B): <10 mg/dL (ref <10)

## 2021-12-09 LAB — BASIC METABOLIC PANEL
Anion gap: 22 — ABNORMAL HIGH (ref 5–15)
BUN: 9 mg/dL (ref 6–20)
CO2: 17 mmol/L — ABNORMAL LOW (ref 22–32)
Calcium: 9.9 mg/dL (ref 8.9–10.3)
Chloride: 99 mmol/L (ref 98–111)
Creatinine, Ser: 0.93 mg/dL (ref 0.44–1.00)
GFR, Estimated: >60 mL/min (ref >60)
Glucose, Bld: 90 mg/dL (ref 70–99)
Potassium: 4.3 mmol/L (ref 3.5–5.1)
Sodium: 138 mmol/L (ref 135–145)

## 2021-12-09 LAB — LIPASE, BLOOD: Lipase: 156 U/L — ABNORMAL HIGH (ref 11–51)

## 2021-12-09 LAB — I-STAT BETA HCG BLOOD, ED (MC, WL, AP ONLY): I-stat hCG, quantitative: <5 m[IU]/mL (ref <5)

## 2021-12-09 MED ORDER — IOHEXOL 300 MG/ML  SOLN
100.0000 mL | Freq: Once | INTRAMUSCULAR | Status: AC | PRN
  Administered 2021-12-09: 100 mL via INTRAVENOUS

## 2021-12-09 MED ORDER — DROPERIDOL 2.5 MG/ML IJ SOLN
1.2500 mg | Freq: Once | INTRAMUSCULAR | Status: AC
  Administered 2021-12-09: 1.25 mg via INTRAVENOUS
  Filled 2021-12-09: qty 2

## 2021-12-09 MED ORDER — FENTANYL CITRATE PF 50 MCG/ML IJ SOSY
25.0000 ug | PREFILLED_SYRINGE | INTRAMUSCULAR | Status: AC
  Administered 2021-12-09: 25 ug via INTRAVENOUS
  Filled 2021-12-09: qty 1

## 2021-12-09 MED ORDER — LACTATED RINGERS IV BOLUS
1000.0000 mL | Freq: Once | INTRAVENOUS | Status: AC
  Administered 2021-12-09: 1000 mL via INTRAVENOUS

## 2021-12-09 MED ORDER — FENTANYL CITRATE PF 50 MCG/ML IJ SOSY
25.0000 ug | PREFILLED_SYRINGE | Freq: Once | INTRAMUSCULAR | Status: AC
  Administered 2021-12-09: 25 ug via INTRAVENOUS
  Filled 2021-12-09: qty 1

## 2021-12-09 MED ORDER — ONDANSETRON HCL 4 MG/2ML IJ SOLN
4.0000 mg | Freq: Once | INTRAMUSCULAR | Status: AC
  Administered 2021-12-09: 4 mg via INTRAVENOUS
  Filled 2021-12-09: qty 2

## 2021-12-09 MED ORDER — OXYCODONE-ACETAMINOPHEN 5-325 MG PO TABS: 1.0000 | ORAL_TABLET | Freq: Three times a day (TID) | ORAL | 0 refills | Status: DC | PRN

## 2021-12-09 MED ORDER — PANTOPRAZOLE SODIUM 40 MG IV SOLR
40.0000 mg | Freq: Once | INTRAVENOUS | Status: AC
  Administered 2021-12-09: 40 mg via INTRAVENOUS
  Filled 2021-12-09: qty 10

## 2021-12-09 MED ORDER — SODIUM CHLORIDE 0.9 % IV BOLUS
1000.0000 mL | Freq: Once | INTRAVENOUS | Status: AC
  Administered 2021-12-09: 1000 mL via INTRAVENOUS

## 2021-12-09 MED ORDER — DICYCLOMINE HCL 10 MG PO CAPS
20.0000 mg | ORAL_CAPSULE | Freq: Once | ORAL | Status: AC
Start: 2021-12-09 — End: 2021-12-09
  Administered 2021-12-09: 20 mg via ORAL
  Filled 2021-12-09: qty 2

## 2021-12-09 MED ORDER — ONDANSETRON 4 MG PO TBDP: 4.0000 mg | ORAL_TABLET | Freq: Three times a day (TID) | ORAL | 0 refills | Status: DC | PRN

## 2021-12-09 NOTE — ED Notes (Signed)
Patient unable to provide urine specimen at this time.

## 2021-12-09 NOTE — ED Provider Notes (Signed)
[facility] EMERGENCY DEPARTMENT Provider Note   CSN: [id number] Arrival date & time: 12/09/21  0703     History  Chief Complaint  Patient presents with   Abdominal Pain    Mariah Bradley is a 26 y.o. female.   Abdominal Pain Patient is a 26 year old female with a past medical history significant for anxiety depression PTSD and alcoholic gastritis  She states that on Sunday of this past week she went out with friends and drank she states approximately 6 drinks but may be more.  She states that Monday she did not eat any food until 3 PM but states that after she ate some food on Monday she had several episodes of nonbloody nonbilious emesis.  She states that she started having abdominal pain later in the evening on Monday and her pain has been intermittent since that time.  She states that it is constant since 10 PM yesterday.  She states that she has had some loose stool.  She informed triage nurse that she has not had any bowel movements since Monday however she tells me that she is simply not had "good "bowel movements  She endorses continued nausea and vomiting. Denies any chest pain or difficulty breathing.  No fevers, cough, congestion, syncope or near syncope.  No vaginal discharge no urinary frequency urgency dysuria hematuria.    Home Medications Prior to Admission medications   Medication Sig Start Date End Date Taking? Authorizing Provider  busPIRone (BUSPAR) 10 MG tablet Take 10 mg by mouth 3 (three) times daily. When able to remember 10/16/19  Yes [provider]  hydrOXYzine (VISTARIL) 50 MG capsule Take 50 mg by mouth at bedtime as needed (sleep). 09/23/21  Yes [provider]  ondansetron (ZOFRAN-ODT) 4 MG disintegrating tablet Take 1 tablet (4 mg total) by mouth every 8 (eight) hours as needed for nausea or vomiting. 12/09/21  Yes Letonya Mangels S, PA  oxyCODONE-acetaminophen (PERCOCET/ROXICET) 5-325 MG tablet Take 1 tablet by mouth  every 8 (eight) hours as needed for severe pain. 12/09/21  Yes Clint Strupp S, PA  SEROQUEL 100 MG tablet Take 100 mg by mouth at bedtime. 10/16/19  Yes [provider]  cyclobenzaprine (FLEXERIL) 10 MG tablet Take 1 tablet (10 mg total) by mouth 2 (two) times daily as needed for muscle spasms. 04/19/16   Roxy Horseman, PA-C  hydrOXYzine (ATARAX/VISTARIL) 25 MG tablet Take 1 tablet (25 mg total) by mouth every 6 (six) hours as needed for itching. Patient not taking: Reported on 02/20/2015 11/30/12   Reuben Likes, MD  ibuprofen (ADVIL,MOTRIN) 600 MG tablet Take 1 tablet (600 mg total) by mouth every 6 (six) hours as needed. 04/19/16   Roxy Horseman, PA-C  methocarbamol (ROBAXIN) 500 MG tablet Take 2 tablets (1,000 mg total) by mouth every 8 (eight) hours as needed for muscle spasms. 08/25/15   Loren Racer, MD  ondansetron (ZOFRAN) 4 MG tablet Take 1 tablet (4 mg total) by mouth every 6 (six) hours. 06/25/20   Wynetta Fines, MD  pantoprazole (PROTONIX) 20 MG tablet Take 1 tablet (20 mg total) by mouth daily. 07/12/19   Muthersbaugh, Dahlia Client, PA-C  predniSONE (DELTASONE) 20 MG tablet 3 daily for 5 days, 2 daily for 5 days, 1 daily for 5 days. Patient not taking: Reported on 02/20/2015 11/30/12   Reuben Likes, MD  pseudoephedrine (SUDAFED) 60 MG tablet Take 1 tablet (60 mg total) by mouth every 6 (six) hours as needed for congestion. Patient not  taking: Reported on 02/20/2015 03/28/14   Junious Silk, PA-C  triamcinolone cream (KENALOG) 0.1 % Apply topically 3 (three) times daily. Patient not taking: Reported on 02/20/2015 11/30/12   Reuben Likes, MD      Allergies    Calamine    Review of Systems   Review of Systems  Gastrointestinal:  Positive for abdominal pain.    Physical Exam Updated Vital Signs BP (!) 146/87   Pulse (!) 59   Temp (!) 97.4 F (36.3 C) (Oral)   Resp 18   SpO2 100%  Physical Exam Vitals and nursing note reviewed.  Constitutional:      General: She  is not in acute distress.    Comments: Occasionally retching 26 year old female.  Nontoxic-appearing.  HENT:     Head: Normocephalic and atraumatic.     Nose: Nose normal.     Mouth/Throat:     Mouth: Mucous membranes are dry.  Eyes:     General: No scleral icterus. Cardiovascular:     Rate and Rhythm: Normal rate and regular rhythm.     Pulses: Normal pulses.     Heart sounds: Normal heart sounds.  Pulmonary:     Effort: Pulmonary effort is normal. No respiratory distress.     Breath sounds: No wheezing.  Abdominal:     Palpations: Abdomen is soft.     Tenderness: There is abdominal tenderness.     Comments: Upper abdominal tenderness.  No guarding or rebound.  Musculoskeletal:     Cervical back: Normal range of motion.     Right lower leg: No edema.     Left lower leg: No edema.  Skin:    General: Skin is warm and dry.     Capillary Refill: Capillary refill takes less than 2 seconds.  Neurological:     Mental Status: She is alert. Mental status is at baseline.  Psychiatric:        Mood and Affect: Mood normal.        Behavior: Behavior normal.     ED Results / Procedures / Treatments   Labs (all labs ordered are listed, but only abnormal results are displayed) Labs Reviewed  LIPASE, BLOOD - Abnormal; Notable for the following components:      Result Value   Lipase 156 (*)    All other components within normal limits  COMPREHENSIVE METABOLIC PANEL - Abnormal; Notable for the following components:   CO2 16 (*)    Glucose, Bld 103 (*)    AST 122 (*)    ALT 105 (*)    Total Bilirubin 1.4 (*)    Anion gap 21 (*)    All other components within normal limits  CBC - Abnormal; Notable for the following components:   RBC 3.63 (*)    MCV 107.2 (*)    MCH 35.5 (*)    All other components within normal limits  URINALYSIS, ROUTINE W REFLEX MICROSCOPIC - Abnormal; Notable for the following components:   Ketones, ur 80 (*)    Protein, ur 30 (*)    All other components  within normal limits  BASIC METABOLIC PANEL - Abnormal; Notable for the following components:   CO2 17 (*)    Anion gap 22 (*)    All other components within normal limits  SALICYLATE LEVEL - Abnormal; Notable for the following components:   Salicylate Lvl <7.0 (*)    All other components within normal limits  I-STAT VENOUS BLOOD GAS, ED - Abnormal; Notable for  the following components:   pCO2, Ven 29.3 (*)    Bicarbonate 13.6 (*)    TCO2 14 (*)    Acid-base deficit 12.0 (*)    Sodium 134 (*)    Calcium, Ion 1.13 (*)    All other components within normal limits  ETHANOL  I-STAT BETA HCG BLOOD, ED ([facility], [facility], [facility] ONLY)    EKG EKG Interpretation  Date/Time:  Thursday December 09 2021 08:15:29 EDT Ventricular Rate:  48 PR Interval:  134 QRS Duration: 88 QT Interval:  499 QTC Calculation: 446 R Axis:   50 Text Interpretation: Sinus bradycardia Atrial premature complexes in couplets Confirmed by Alona BeneLong, Joshua [phone]([id number]) on 12/09/2021 8:30:06 AM  Radiology CT ABDOMEN PELVIS W CONTRAST  Result Date: 12/09/2021 CLINICAL DATA:  Epigastric pain, suspect new pancreatitis EXAM: CT ABDOMEN AND PELVIS WITH CONTRAST TECHNIQUE: Multidetector CT imaging of the abdomen and pelvis was performed using the standard protocol following bolus administration of intravenous contrast. RADIATION DOSE REDUCTION: This exam was performed according to the departmental dose-optimization program which includes automated exposure control, adjustment of the mA and/or kV according to patient size and/or use of iterative reconstruction technique. CONTRAST:  100mL OMNIPAQUE IOHEXOL 300 MG/ML  SOLN COMPARISON:  None Available. FINDINGS: Lower chest: No acute abnormality. Hepatobiliary: Possible steatosis. Possible mild gallbladder wall thickening. There is pericholecystic fluid. No biliary dilatation. Pancreas: Preserved enhancement. No mass or duct dilatation. Surrounding edema. Spleen: Unremarkable. Adrenals/Urinary Tract:  Adrenals, kidneys, and bladder are unremarkable. Stomach/Bowel: Stomach is within normal limits. Bowel is normal in caliber. Vascular/Lymphatic: No significant vascular abnormality. No enlarged nodes. Reproductive: Uterus and bilateral adnexa are unremarkable. Other: No free air.  No abscess. Musculoskeletal: No acute osseous abnormality. IMPRESSION: Peripancreatic edema without discrete collection. Likely reflects acute pancreatitis. Mild gallbladder wall thickening is probably reactive. Electronically Signed   By: Guadlupe SpanishPraneil  Patel M.D.   On: 12/09/2021 12:53   US Abdomen Limited RUQ (LIVER/GB)  Result Date: 12/09/2021 CLINICAL DATA:  Pain right upper quadrant of abdomen EXAM: ULTRASOUND ABDOMEN LIMITED RIGHT UPPER QUADRANT COMPARISON:  None Available. FINDINGS: Gallbladder: No gallstones or wall thickening visualized. No sonographic Murphy sign noted by sonographer. Common bile duct: Diameter: 4 mm Liver: There is increased echogenicity in the liver suggesting fatty infiltration. No focal abnormality is seen. Portal vein is patent on color Doppler imaging with normal direction of blood flow towards the liver. Other: None. IMPRESSION: Fatty liver. No other sonographic abnormality is seen in the right upper quadrant of abdomen. Electronically Signed   By: Ernie AvenaPalani  Rathinasamy M.D.   On: 12/09/2021 10:52    Procedures Procedures    Medications Ordered in ED Medications  ondansetron (ZOFRAN) injection 4 mg (4 mg Intravenous Given 12/09/21 0821)  sodium chloride 0.9 % bolus 1,000 mL (0 mLs Intravenous Stopped 12/09/21 0942)  dicyclomine (BENTYL) capsule 20 mg (20 mg Oral Given 12/09/21 0859)  pantoprazole (PROTONIX) injection 40 mg (40 mg Intravenous Given 12/09/21 0859)  droperidol (INAPSINE) 2.5 MG/ML injection 1.25 mg (1.25 mg Intravenous Given 12/09/21 0859)  lactated ringers bolus 1,000 mL (0 mLs Intravenous Stopped 12/09/21 1252)  fentaNYL (SUBLIMAZE) injection 25 mcg (25 mcg Intravenous Given 12/09/21  1008)  fentaNYL (SUBLIMAZE) injection 25 mcg (25 mcg Intravenous Given 12/09/21 1220)  iohexol (OMNIPAQUE) 300 MG/ML solution 100 mL (100 mLs Intravenous Contrast Given 12/09/21 1246)    ED Course/ Medical Decision Making/ A&P Clinical Course as of 12/09/21 1339  Thu Dec 09, 2021  0750 Monday developed NV intermittently since then. Since 10pm last  night she's been vomiting frequently.  [WF]  1309 IMPRESSION: Peripancreatic edema without discrete collection. Likely reflects acute pancreatitis.  Mild gallbladder wall thickening is probably reactive.   Electronically Signed   By: Guadlupe Spanish M.D.   On: 12/09/2021 12:53   [WF]    Clinical Course User Index [WF] Gailen Shelter, PA                           Medical Decision Making Amount and/or Complexity of Data Reviewed Labs: ordered. Radiology: ordered.  Risk Prescription drug management.   This patient presents to the ED for concern of abdominal pain, this involves a number of treatment options, and is a complaint that carries with it a moderate to high risk of complications and morbidity.  The differential diagnosis includes The causes of generalized abdominal pain include but are not limited to AAA, mesenteric ischemia, appendicitis, diverticulitis, DKA, gastritis, gastroenteritis, AMI, nephrolithiasis, pancreatitis, peritonitis, adrenal insufficiency,lead poisoning, iron toxicity, intestinal ischemia, constipation, UTI,SBO/LBO, splenic rupture, biliary disease, IBD, IBS, PUD, or hepatitis. Patient has upper abdominal pain low suspicion for ectopic pregnancy, ovarian torsion, PID.    Co morbidities: Discussed in HPI   Brief History:  Patient is a 26 year old female with a past medical history significant for anxiety depression PTSD and alcoholic gastritis  She states that on Sunday of this past week she went out with friends and drank she states approximately 6 drinks but may be more.  She states that Monday she  did not eat any food until 3 PM but states that after she ate some food on Monday she had several episodes of nonbloody nonbilious emesis.  She states that she started having abdominal pain later in the evening on Monday and her pain has been intermittent since that time.  She states that it is constant since 10 PM yesterday.  She states that she has had some loose stool.  She informed triage nurse that she has not had any bowel movements since Monday however she tells me that she is simply not had "good "bowel movements  She endorses continued nausea and vomiting. Denies any chest pain or difficulty breathing.  No fevers, cough, congestion, syncope or near syncope.  No vaginal discharge no urinary frequency urgency dysuria hematuria.    EMR reviewed including pt PMHx, past surgical history and past visits to ER.   See HPI for more details   Lab Tests:   I ordered and independently interpreted labs. Labs notable for elevated lipase consistent with pancreatitis, CBC without leukocytosis or anemia.  CMP with transaminitis consistent with alcohol abuse.  Given some right upper quadrant pain will obtain ultrasound.  BMP and CMP notable for anion gap.  BMP was obtained after patient receive 1.5 L of crystalloid.     Abnormal findings. I personally reviewed all imaging studies. Imaging notable for evidence of pancreatitis on CT.  Right upper quadrant ultrasound without biliary stones or ductal abnormalities.  I personally viewed the images from the studies  Cardiac Monitoring:  The patient was maintained on a cardiac monitor.  I personally viewed and interpreted the cardiac monitored which showed an underlying rhythm of: NSR EKG non-ischemic   Medicines ordered:  I ordered medication including fentanyl, 2 L of crystalloid, droperidol, Protonix, Zofran for abdominal pain nausea vomiting Reevaluation of the patient after these medicines showed that the patient resolved I have reviewed the  patients home medicines and have made adjustments as needed  Critical Interventions:     Consults/Attending Physician   I discussed this case with my attending physician who cosigned this note including patient's presenting symptoms, physical exam, and planned diagnostics and interventions. Attending physician stated agreement with plan or made changes to plan which were implemented.   Reevaluation:  After the interventions noted above I re-evaluated patient and found that they have :resolved   Social Determinants of Health:      Problem List / ED Course:  Pancreatitis likely secondary to alcohol use.  She understands importance of alcohol cessation, patient's pain has resolved at this time.  She has been tolerating p.o. after droperidol.  Will discharge home with recommendations for Tylenol ibuprofen at home.  Patient is with mother and I recommend the mother hold onto the short course of Percocet that I provided her for breakthrough pain.  Zofran for nausea.  Follow-up with gastroenterology.  Return precautions discussed   Dispostion:  After consideration of the diagnostic results and the patients response to treatment, I feel that the patent would benefit from outpatient follow-up with GI    Final Clinical Impression(s) / ED Diagnoses Final diagnoses:  Alcohol-induced acute pancreatitis without infection or necrosis    Rx / DC Orders ED Discharge Orders          Ordered    ondansetron (ZOFRAN-ODT) 4 MG disintegrating tablet  Every 8 hours PRN        12/09/21 1314    oxyCODONE-acetaminophen (PERCOCET/ROXICET) 5-325 MG tablet  Every 8 hours PRN        12/09/21 1314              Solon Augusta [place], [place] 12/09/21 1339    Maia Plan, MD 12/10/21 0725

## 2021-12-09 NOTE — Discharge Instructions (Signed)
I recommend a full liquid diet.  I have printed this out for you.  Please drink plenty of water, take your medications as prescribed.  I have written you a prescription for 4 tablets of Percocet for breakthrough pain however I like you to use Tylenol 1000 mg every 6 hours and ibuprofen 600 mg every 6 hours and alternate between the 2 these.  I recommend refraining from fatty foods.  I also written you a prescription for Zofran which is a nausea medicine

## 2021-12-09 NOTE — ED Notes (Signed)
Pt in room dry heaving. C/o diffuse abd pain 1 day. Tender to touch. Denies dysuria. N/V present without diarrhea. Pt reports last BM 4 days ago.

## 2021-12-09 NOTE — ED Triage Notes (Signed)
Patient here with compliant of sudden onset of abdominal pain and vomiting that started yesterday at approximately 2200. Patient denies diarrhea, reports no bowel movement since onset of symptoms. Patient is alert, oriented, and in no apparent distress at this time.

## 2021-12-16 DIAGNOSIS — F4310 Post-traumatic stress disorder, unspecified: Secondary | ICD-10-CM | POA: Diagnosis not present

## 2022-05-04 ENCOUNTER — Ambulatory Visit: Payer: BC Managed Care – PPO | Admitting: Podiatry

## 2022-07-07 DIAGNOSIS — F4310 Post-traumatic stress disorder, unspecified: Secondary | ICD-10-CM | POA: Diagnosis not present

## 2022-10-11 DIAGNOSIS — F411 Generalized anxiety disorder: Secondary | ICD-10-CM | POA: Diagnosis not present

## 2022-10-13 DIAGNOSIS — F1220 Cannabis dependence, uncomplicated: Secondary | ICD-10-CM | POA: Diagnosis not present

## 2022-11-04 DIAGNOSIS — F4310 Post-traumatic stress disorder, unspecified: Secondary | ICD-10-CM | POA: Diagnosis not present

## 2022-11-15 DIAGNOSIS — F1220 Cannabis dependence, uncomplicated: Secondary | ICD-10-CM | POA: Diagnosis not present

## 2022-12-29 DIAGNOSIS — F4310 Post-traumatic stress disorder, unspecified: Secondary | ICD-10-CM | POA: Diagnosis not present

## 2023-05-19 DIAGNOSIS — Z79899 Other long term (current) drug therapy: Secondary | ICD-10-CM | POA: Diagnosis not present

## 2023-07-18 ENCOUNTER — Ambulatory Visit (HOSPITAL_COMMUNITY)
Admission: EM | Admit: 2023-07-18 | Discharge: 2023-07-18 | Disposition: A | Discharge status: Home / Self Care | Payer: BC Managed Care – PPO

## 2023-07-18 DIAGNOSIS — Z760 Encounter for issue of repeat prescription: Secondary | ICD-10-CM | POA: Diagnosis not present

## 2023-07-18 NOTE — ED Provider Notes (Signed)
 [facility] Medical Screening Exam  Patient Name: Mariah Bradley MRN: [id number] Date of Evaluation: 07/18/23 Chief Complaint:  I'm out of my med Diagnosis:  Final diagnoses:  Encounter for medication refill    History of Present illness: Mariah Bradley is a 28 y.o. female.  Her psychiatric history of anxiety, depression, and PTSD, who presented voluntarily as a walk-in to [facility] requesting medication refill.  Patient was seen face-to-face by this provider and chart reviewed with Dr.Zouev.  On evaluation, patient is alert, oriented x 4, and cooperative. Speech is clear, normal rate and coherent. Pt appears fairly groomed. Eye contact is good. Mood is euthymic, affect is congruent with mood. Thought process is coherent and thought content is WDL. Pt denies SI/HI/AVH or paranoia. There is no objective indication that the patient is responding to internal stimuli. No delusions elicited during this assessment.     Patient reports the psychiatrist said if I ever could not get my medicines, to come here, and I'm out of my quetiapine  over the weekend, but I saved a dose which I took last night, because I need it to sleep, I'm also prescribed trazodone  and BuSpar  and I still have some of those medicines, I just need the quetiapine  otherwise I won't be able to sleep for days.   Patient reports she [facility] [place] in November and missed a January appointment. She reports calling her outpatient pharmacy to pick up refills of her medicine but was informed there are no refills left and she has not been able to schedule an outpatient psychiatric appointment with her provider.   Patient denies recreational substance use.  She lives with her mother and sometimes stays with her.  She denies access to gun or weapon.  She denies history of suicide attempts or self-harm behaviors.  She reports being generally med patient  adherent.  Patient educated on basics of medication management and importance of keeping up with outpatient appointments.  Discussed recommendation for discharge and follow-up with [facility] outpatient walk-in clinic for new patient med management or contacting her current outpatient psychiatric provider for med management and refill.   Patient verbalizes understanding and is agreeable with plan.  Flowsheet Row ED from 07/18/2023 in [facility] ED from 12/09/2021 in [facility] Emergency Department at [facility]  C-SSRS RISK CATEGORY No Risk No Risk       Psychiatric Specialty Exam  Presentation  General Appearance:Fairly Groomed  Eye Contact:Good  Speech:Clear and Coherent  Speech Volume:Normal  Handedness:Right   Mood and Affect  Mood: Euthymic  Affect: Congruent   Thought Process  Thought Processes: Coherent  Descriptions of Associations:Intact  Orientation:Full (Time, Place and Person)  Thought Content:WDL    Hallucinations:None  Ideas of Reference:None  Suicidal Thoughts:No  Homicidal Thoughts:No   Sensorium  Memory: Immediate Good  Judgment: Intact  Insight: Present   Executive Functions  Concentration: Good  Attention Span: Good  Recall: Good  Fund of Knowledge: Good  Language: Good   Psychomotor Activity  Psychomotor Activity: Normal   Assets  Assets: Communication Skills; Desire for Improvement; Social Support   Sleep  Sleep: Fair  Number of hours: No data recorded  Physical Exam: Physical Exam Constitutional:      Appearance: She is not toxic-appearing or diaphoretic.  HENT:     Head: Normocephalic.     Right Ear: External ear normal.     Left Ear: External ear normal.  Nose: No congestion.  Eyes:     General:        Right eye: No discharge.        Left eye: No discharge.  Cardiovascular:     Rate and Rhythm: Normal rate.  Pulmonary:     Effort: No respiratory  distress.  Chest:     Chest wall: No tenderness.  Neurological:     Mental Status: She is alert and oriented to person, place, and time.  Psychiatric:        Attention and Perception: Attention and perception normal.        Mood and Affect: Mood and affect normal.        Speech: Speech normal.        Behavior: Behavior is cooperative.        Thought Content: Thought content normal.        Cognition and Memory: Cognition and memory normal.    Review of Systems  Constitutional:  Negative for chills, diaphoresis and fever.  HENT:  Negative for congestion.   Eyes:  Negative for discharge.  Respiratory:  Negative for cough, shortness of breath and wheezing.   Cardiovascular:  Negative for chest pain and palpitations.  Gastrointestinal:  Negative for diarrhea, nausea and vomiting.  Neurological:  Negative for dizziness, seizures, weakness and headaches.  Psychiatric/Behavioral: Negative.     Blood pressure (!) 129/96, pulse 65, temperature 98.4 F (36.9 C), temperature source Oral, resp. rate 18, SpO2 100%. There is no height or weight on file to calculate BMI.  Musculoskeletal: Strength & Muscle Tone: within normal limits Gait & Station: normal Patient leans: N/A   [facility] MSE Discharge Disposition for Follow up and Recommendations: Based on my evaluation the patient does not appear to have an emergency medical condition and can be discharged with resources and follow up care in outpatient services for Medication Management  Patient denies SI/HI/AVH or paranoia.  Patient does not meet inpatient psychiatric admission criteria or IVC criteria at this time.  There is no evidence of imminent risk of harm to self or others.  Recommend discharge and follow-up with outpatient psychiatric services for medication management/refills.  Resources provided for [facility] outpatient walk-in clinic.  Discharge recommendations:  Patient is to take medications as prescribed. Please follow up with your  primary care provider for all medical related needs.   Medications: The patient or guardian is to contact a medical professional and/or outpatient provider to address any new side effects that develop. The patient or guardian should update outpatient providers of any new medications and/or medication changes.   Atypical antipsychotics: If you are prescribed an atypical antipsychotic, it is recommended that your height, weight, BMI, blood pressure, fasting lipid panel, and fasting blood sugar be monitored by your outpatient providers.  Safety:  The patient should abstain from use of illicit substances/drugs and abuse of any medications. If symptoms worsen or do not continue to improve or if the patient becomes actively suicidal or homicidal then it is recommended that the patient return to the closest hospital emergency department, the [facility], or call 911 for further evaluation and treatment. National Suicide Prevention Lifeline 1-800-SUICIDE or 9054312656.  About 988 988 offers 24/7 access to trained crisis counselors who can help people experiencing mental health-related distress. People can call or text 988 or chat 988lifeline.org for themselves or if they are worried about a loved one who may need crisis support.  Crisis Mobile: [facility]:  (724)635-3562 (for crisis response 24 hours a day) [facility] Hotline:                                            (818)607-3712   Patient discharged in stable condition.   Derika Eckles LULLA Ivans, NP 07/18/2023, 1:16 AM
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
## 2023-07-18 NOTE — Discharge Instructions (Addendum)

## 2023-07-19 ENCOUNTER — Encounter (HOSPITAL_COMMUNITY): Payer: Self-pay

## 2023-07-19 ENCOUNTER — Ambulatory Visit (HOSPITAL_COMMUNITY)
Admission: RE | Admit: 2023-07-19 | Discharge: 2023-07-19 | Disposition: A | Discharge status: Home / Self Care | Payer: BC Managed Care – PPO | Source: Ambulatory Visit

## 2023-07-19 VITALS — BP 119/88 | HR 80 | Temp 98.6°F | Resp 16 | Ht 65.0 in | Wt 135.0 lb

## 2023-07-19 DIAGNOSIS — R040 Epistaxis: Secondary | ICD-10-CM | POA: Diagnosis not present

## 2023-07-19 NOTE — Discharge Instructions (Addendum)
 To help prevent nosebleeds you can apply direct pressure and lean forward. If it continues you can try Afrin. Moisturize the air with a humidifier and you can use OTC Vaseline in your nostrils nightly to help lubricate the nares. Avoid picking or heavily blowing the nose.  Follow-up with the ENT if this continues.  If you have a nosebleed that you cannot get to stop for over 15 minutes then please seek follow-up care.

## 2023-07-19 NOTE — ED Provider Notes (Signed)
 [facility]    CSN: [id number] Arrival date & time: 07/19/23  1527      History   Chief Complaint Chief Complaint  Patient presents with   Epistaxis    Very heavy nose bleed on Sunday another that night. Higher blood pressure than recorded highest was 158/110 at approx 1 am 07/17/23 and blood in my nose at 2:30am - Entered by patient    HPI Mariah Bradley is a 28 y.o. female.   Patient presents to clinic complaining of a left-sided nosebleed that happened twice on Sunday.  Has not had any nosebleeds since.  Has not been congested or sick recently.  On Sunday she felt a sudden gush of blood and saturated a few paper towels.  Then her nose suddenly stopped bleeding without intervention.  Later that night she had a small nosebleed with a few drops this stopped spontaneously.  Had some dizziness and went to lay down after the first nosebleed.  No syncope or loss of consciousness.  No further nosebleeds.  Has not used any nose sprays.  [name] and medical records.  Epistaxis   Past Medical History:  Diagnosis Date   Anxiety    Depression    PTSD (post-traumatic stress disorder)     There are no active problems to display for this patient.   Past Surgical History:  Procedure Laterality Date   TONSILLECTOMY      OB History   No obstetric history on file.      Home Medications    Prior to Admission medications   Medication Sig Start Date End Date Taking? Authorizing Provider  busPIRone  (BUSPAR ) 10 MG tablet Take 10 mg by mouth 3 (three) times daily. When able to remember 10/16/19  Yes [provider]  SEROQUEL  100 MG tablet Take 100 mg by mouth at bedtime. 10/16/19  Yes [provider]  traZODone  (DESYREL ) 100 MG tablet Take 50-100 mg by mouth daily as needed. 07/13/23  Yes [provider]  cyclobenzaprine  (FLEXERIL ) 10 MG tablet Take 1 tablet (10 mg total) by mouth 2 (two) times daily as needed for  muscle spasms. 04/19/16   Vicky Charleston, PA-C  hydrOXYzine  (ATARAX /VISTARIL ) 25 MG tablet Take 1 tablet (25 mg total) by mouth every 6 (six) hours as needed for itching. Patient not taking: Reported on 02/20/2015 11/30/12   Sonda Alm BROCKS, MD  hydrOXYzine  (VISTARIL ) 50 MG capsule Take 50 mg by mouth at bedtime as needed (sleep). 09/23/21   [provider]  ibuprofen  (ADVIL ,MOTRIN ) 600 MG tablet Take 1 tablet (600 mg total) by mouth every 6 (six) hours as needed. 04/19/16   Vicky Charleston, PA-C  methocarbamol  (ROBAXIN ) 500 MG tablet Take 2 tablets (1,000 mg total) by mouth every 8 (eight) hours as needed for muscle spasms. 08/25/15   Carlyle Alm, MD  ondansetron  (ZOFRAN ) 4 MG tablet Take 1 tablet (4 mg total) by mouth every 6 (six) hours. 06/25/20   Laurice Maude BROCKS, MD  ondansetron  (ZOFRAN -ODT) 4 MG disintegrating tablet Take 1 tablet (4 mg total) by mouth every 8 (eight) hours as needed for nausea or vomiting. 12/09/21   Neldon Hamp RAMAN, PA  oxyCODONE -acetaminophen  (PERCOCET/ROXICET) 5-325 MG tablet Take 1 tablet by mouth every 8 (eight) hours as needed for severe pain. 12/09/21   Neldon Hamp RAMAN, PA  pantoprazole  (PROTONIX ) 20 MG tablet Take 1 tablet (20 mg total) by mouth daily. 07/12/19   Muthersbaugh, Chiquita, PA-C  predniSONE  (DELTASONE ) 20 MG  tablet 3 daily for 5 days, 2 daily for 5 days, 1 daily for 5 days. Patient not taking: Reported on 02/20/2015 11/30/12   Sonda Alm BROCKS, MD  pseudoephedrine  (SUDAFED) 60 MG tablet Take 1 tablet (60 mg total) by mouth every 6 (six) hours as needed for congestion. Patient not taking: Reported on 02/20/2015 03/28/14   Remonia Lacks, PA-C  triamcinolone  cream (KENALOG ) 0.1 % Apply topically 3 (three) times daily. Patient not taking: Reported on 02/20/2015 11/30/12   Sonda Alm BROCKS, MD    Family History Family History  Problem Relation Age of Onset   Stroke Mother    Diabetes Father     Social History Social History   Tobacco Use   Smoking  status: Never   Smokeless tobacco: Never  Vaping Use   Vaping status: Never Used  Substance Use Topics   Alcohol use: Yes   Drug use: Not Currently    Types: Marijuana     Allergies   Calamine   Review of Systems Review of Systems  Per HPI   Physical Exam Triage Vital Signs ED Triage Vitals  Encounter Vitals Group     BP 07/19/23 1607 119/88     Systolic BP Percentile --      Diastolic BP Percentile --      Pulse Rate 07/19/23 1607 80     Resp 07/19/23 1607 16     Temp 07/19/23 1607 98.6 F (37 C)     Temp Source 07/19/23 1607 Oral     SpO2 07/19/23 1607 99 %     Weight 07/19/23 1609 135 lb (61.2 kg)     Height 07/19/23 1609 5' 5 (1.651 m)     Head Circumference --      Peak Flow --      Pain Score 07/19/23 1609 0     Pain Loc --      Pain Education --      Exclude from Growth Chart --    No data found.  Updated Vital Signs BP 119/88 (BP Location: Left Arm)   Pulse 80   Temp 98.6 F (37 C) (Oral)   Resp 16   Ht 5' 5 (1.651 m)   Wt 135 lb (61.2 kg)   LMP 07/13/2023 (Exact Date)   SpO2 99%   BMI 22.47 kg/m   Visual Acuity Right Eye Distance:   Left Eye Distance:   Bilateral Distance:    Right Eye Near:   Left Eye Near:    Bilateral Near:     Physical Exam Vitals and nursing note reviewed.  Constitutional:      Appearance: Normal appearance.  HENT:     Head: Normocephalic and atraumatic.     Right Ear: External ear normal.     Left Ear: External ear normal.     Nose: Nose normal. No congestion or rhinorrhea.     Mouth/Throat:     Mouth: Mucous membranes are moist.  Eyes:     Conjunctiva/sclera: Conjunctivae normal.  Cardiovascular:     Rate and Rhythm: Normal rate.  Pulmonary:     Effort: Pulmonary effort is normal. No respiratory distress.  Musculoskeletal:        General: Normal range of motion.  Skin:    General: Skin is warm and dry.  Neurological:     General: No focal deficit present.     Mental Status: She is alert and  oriented to person, place, and time.  Psychiatric:  Mood and Affect: Mood normal.        Behavior: Behavior normal.      UC Treatments / Results  Labs (all labs ordered are listed, but only abnormal results are displayed) Labs Reviewed - No data to display  EKG   Radiology No results found.  Procedures Procedures (including critical care time)  Medications Ordered in UC Medications - No data to display  Initial Impression / Assessment and Plan / UC Course  I have reviewed the triage vital signs and the nursing notes.  Pertinent labs & imaging results that were available during my care of the patient were reviewed by me and considered in my medical decision making (see chart for details).  Vitals and triage reviewed, patient is hemodynamically stable.  Bilateral nares without any current bleeding or crusted blood.  No congestion or rhinorrhea.  Ways to prevent and stop nosebleeds discussed.  Plan of care, follow-up care return precautions given, no questions at this time.    Final Clinical Impressions(s) / UC Diagnoses   Final diagnoses:  Left-sided epistaxis     Discharge Instructions      To help prevent nosebleeds you can apply direct pressure and lean forward. If it continues you can try Afrin. Moisturize the air with a humidifier and you can use OTC Vaseline in your nostrils nightly to help lubricate the nares. Avoid picking or heavily blowing the nose.  Follow-up with the ENT if this continues.  If you have a nosebleed that you cannot get to stop for over 15 minutes then please seek follow-up care.     ED Prescriptions   None    PDMP not reviewed this encounter.   Dreama Rodgers SAILOR, FNP 07/19/23 1655

## 2023-07-19 NOTE — ED Triage Notes (Signed)
 Patient states she had an episode of heavy nose bleeds in January.  She had another heavy nose bleed on Sunday just out of left nostril.  Denies any HA, some dizziness.  Another nose bleed later than night.

## 2023-08-11 ENCOUNTER — Ambulatory Visit (INDEPENDENT_AMBULATORY_CARE_PROVIDER_SITE_OTHER): Payer: BC Managed Care – PPO | Admitting: Family

## 2023-08-11 VITALS — BP 105/68 | HR 90 | Temp 97.8°F | Ht 65.0 in | Wt 132.6 lb

## 2023-08-11 DIAGNOSIS — F4310 Post-traumatic stress disorder, unspecified: Secondary | ICD-10-CM

## 2023-08-11 DIAGNOSIS — Z800 Family history of malignant neoplasm of digestive organs: Secondary | ICD-10-CM

## 2023-08-11 DIAGNOSIS — F419 Anxiety disorder, unspecified: Secondary | ICD-10-CM | POA: Diagnosis not present

## 2023-08-11 DIAGNOSIS — G4700 Insomnia, unspecified: Secondary | ICD-10-CM | POA: Diagnosis not present

## 2023-08-11 DIAGNOSIS — Z7689 Persons encountering health services in other specified circumstances: Secondary | ICD-10-CM

## 2023-08-11 DIAGNOSIS — Z8049 Family history of malignant neoplasm of other genital organs: Secondary | ICD-10-CM

## 2023-08-11 DIAGNOSIS — F32A Depression, unspecified: Secondary | ICD-10-CM | POA: Diagnosis not present

## 2023-08-11 DIAGNOSIS — Z124 Encounter for screening for malignant neoplasm of cervix: Secondary | ICD-10-CM

## 2023-08-11 NOTE — Progress Notes (Signed)
 Patient wants to discuss short term disability from job.

## 2023-08-11 NOTE — Progress Notes (Signed)
 Subjective:    Mariah Bradley - 28 y.o. female MRN [id number]  Date of birth: 1996/05/31  HPI  Mariah Bradley is to establish care.  Current issues and/or concerns: - Reports history of anxiety depression, PTSD, and insomnia. Doing well on Buspirone, Seroquel, and Trazodone, no issues/concerns. Reports established with therapist. Requests referral to new Psychiatry. She denies thoughts of self-harm, suicidal ideations, homicidal ideations. - Patient states planning to apply for disability related to anxiety depression, PTSD, and insomnia. I discussed with patient in detail Psychiatry to complete disability paperwork. Patient verbalized agreement/understanding. - Family history of colon cancer. States she was told from previous provider that she should be screened.  - Family history of uterine cancer.  - No further issues/concerns for discussion today.   ROS per HPI     Health Maintenance:  Health Maintenance Due  Topic Date Due   HIV Screening  Never done   Hepatitis C Screening  Never done   DTaP/Tdap/Td (1 - Tdap) Never done   Cervical Cancer Screening (Pap smear)  Never done   COVID-19 Vaccine (1 - 2024-25 season) Never done     Past Medical History: There are no active problems to display for this patient.     Social History   reports that she has never smoked. She has never used smokeless tobacco. She reports current alcohol use. She reports that she does not currently use drugs after having used the following drugs: Marijuana.   Family History  family history includes Diabetes in her father; Stroke in her mother.   Medications: reviewed and updated   Objective:   Physical Exam BP 105/68   Pulse 90   Temp 97.8 F (36.6 C) (Oral)   Ht 5\' 5"  (1.651 m)   Wt 132 lb 9.6 oz (60.1 kg)   LMP 07/13/2023 (Exact Date)   SpO2 98%   BMI 22.07 kg/m   Physical Exam HENT:     Head: Normocephalic and atraumatic.     Nose: Nose normal.     Mouth/Throat:     Mouth:  Mucous membranes are moist.     Pharynx: Oropharynx is clear.  Eyes:     Extraocular Movements: Extraocular movements intact.     Conjunctiva/sclera: Conjunctivae normal.     Pupils: Pupils are equal, round, and reactive to light.  Cardiovascular:     Rate and Rhythm: Normal rate and regular rhythm.     Pulses: Normal pulses.     Heart sounds: Normal heart sounds.  Pulmonary:     Effort: Pulmonary effort is normal.     Breath sounds: Normal breath sounds.  Musculoskeletal:        General: Normal range of motion.     Cervical back: Normal range of motion and neck supple.  Neurological:     General: No focal deficit present.     Mental Status: She is alert and oriented to person, place, and time.  Psychiatric:        Mood and Affect: Mood normal.        Behavior: Behavior normal.       Assessment & Plan:  Note - I discussed plan of care with Georganna Skeans, MD.   1. Encounter to establish care (Primary) - Patient presents today to establish care. During the interim follow-up with primary provider as scheduled.  - Return for annual physical examination, labs, and health maintenance. Arrive fasting meaning having no food for at least 8 hours prior to appointment. You may have  only water or black coffee. Please take scheduled medications as normal.  2. Anxiety and depression 3. Insomnia, unspecified type 4. PTSD (post-traumatic stress disorder) - Patient denies thoughts of self-harm, suicidal ideations, homicidal ideations. - Continue present management.  - Referral to Psychiatry for evaluation/management. - Follow-up with primary provider as scheduled. - Ambulatory referral to Psychiatry  5. Pap smear for cervical cancer screening 6. Family history of uterine cancer - Referral to Gynecology for evaluation/management. - Ambulatory referral to Gynecology  7. Family history of colon cancer - Referral to Gastroenterology for evaluation/management. - Ambulatory referral to  Gastroenterology    Patient was given clear instructions to go to Emergency Department or return to medical center if symptoms don't improve, worsen, or new problems develop.The patient verbalized understanding.  I discussed the assessment and treatment plan with the patient. The patient was provided an opportunity to ask questions and all were answered. The patient agreed with the plan and demonstrated an understanding of the instructions.   The patient was advised to call back or seek an in-person evaluation if the symptoms worsen or if the condition fails to improve as anticipated.    Ricky Stabs, NP 08/11/2023, 2:18 PM Primary Care at [facility]

## 2023-08-19 DIAGNOSIS — R1084 Generalized abdominal pain: Secondary | ICD-10-CM | POA: Diagnosis not present

## 2023-08-19 DIAGNOSIS — R112 Nausea with vomiting, unspecified: Secondary | ICD-10-CM | POA: Diagnosis not present

## 2023-08-21 ENCOUNTER — Encounter: Payer: Self-pay | Admitting: Family

## 2023-08-21 ENCOUNTER — Other Ambulatory Visit: Payer: Self-pay | Admitting: Family

## 2023-08-23 ENCOUNTER — Other Ambulatory Visit: Payer: Self-pay | Admitting: Family

## 2023-08-23 NOTE — Telephone Encounter (Signed)
 Confirm with patient which medications need refills.

## 2023-08-23 NOTE — Telephone Encounter (Signed)
 Copied from CRM [id number]. Topic: Clinical - Medication Refill >> Aug 23, 2023  4:27 PM Elle L wrote: Most Recent Primary Care Visit:  Provider: Ricky Stabs J  Department[facility]  Visit Type: NEW PATIENT  Date: 08/11/2023  Medication: traZODone (DESYREL) 100 MG tablet AND busPIRone (BUSPAR) 10 MG tablet AND SEROQUEL 100 MG tablet   Has the patient contacted their pharmacy? Yes  Is this the correct pharmacy for this prescription? Yes If no, delete pharmacy and type the correct one.  This is the patient's preferred pharmacy:  [id number] [place], [place] - [phone] AT [facility] OF [facility] & [address] [place] [address] Phone: [phone] Fax: [phone]   Has the prescription been filled recently? No  Is the patient out of the medication? Yes  Has the patient been seen for an appointment in the last year OR does the patient have an upcoming appointment? Yes  Can we respond through MyChart? Yes  Agent: Please be advised that Rx refills may take up to 3 business days. We ask that you follow-up with your pharmacy.

## 2023-08-24 ENCOUNTER — Telehealth: Payer: Self-pay

## 2023-08-24 MED ORDER — SEROQUEL 100 MG PO TABS: 100.0000 mg | ORAL_TABLET | Freq: Every day | ORAL | 1 refills | Status: AC

## 2023-08-24 MED ORDER — BUSPIRONE HCL 10 MG PO TABS: 10.0000 mg | ORAL_TABLET | Freq: Three times a day (TID) | ORAL | 1 refills | Status: DC

## 2023-08-24 MED ORDER — TRAZODONE HCL 100 MG PO TABS: 50.0000 mg | ORAL_TABLET | Freq: Every evening | ORAL | 1 refills | Status: DC | PRN

## 2023-08-24 NOTE — Telephone Encounter (Signed)
 Reason for CRM: Patient called states that prescription for SEROQUEL 100 MG tablet was written for brand only but insurance will not cover the brand. Wanted to see if it could be change to generic. Thank You

## 2023-08-24 NOTE — Telephone Encounter (Signed)
 Complete

## 2023-08-24 NOTE — Telephone Encounter (Signed)
 Called patient, and voicemail was full.

## 2023-08-24 NOTE — Telephone Encounter (Signed)
 Requested medications are due for refill today.  yes  Requested medications are on the active medications list.  Yes - all as historical  Last refill. Trazodone 07/08/2023 the other 2, 10/16/2019  Future visit scheduled.   no  Notes to clinic.  All medications are historical. New pt.    Requested Prescriptions  Pending Prescriptions Disp Refills   busPIRone (BUSPAR) 10 MG tablet      Sig: Take 1 tablet (10 mg total) by mouth 3 (three) times daily. When able to remember     Psychiatry: Anxiolytics/Hypnotics - Non-controlled Passed - 08/24/2023 10:21 AM      Passed - Valid encounter within last 12 months    Recent Outpatient Visits           1 week ago Encounter to establish care   [facility], Amy J, NP               SEROQUEL 100 MG tablet      Sig: Take 1 tablet (100 mg total) by mouth at bedtime.     Not Delegated - Psychiatry:  Antipsychotics - Second Generation (Atypical) - quetiapine Failed - 08/24/2023 10:21 AM      Failed - This refill cannot be delegated      Failed - TSH in normal range and within 360 days    No results found for: "TSH", "POCTSH", "TSHREFLEX"       Failed - Lipid Panel in normal range within the last 12 months    No results found for: "CHOL", "POCCHOL", "CHOLTOT" No results found for: "LDLCALC", "LDLC", "HIRISKLDL", "POCLDL", "LDLDIRECT", "REALLDLC", "TOTLDLC" No results found for: "HDL", "POCHDL" No results found for: "TRIG", "POCTRIG"       Failed - CBC within normal limits and completed in the last 12 months    WBC  Date Value Ref Range Status  12/09/2021 6.2 4.0 - 10.5 K/uL Final   RBC  Date Value Ref Range Status  12/09/2021 3.63 (L) 3.87 - 5.11 MIL/uL Final   Hemoglobin  Date Value Ref Range Status  12/09/2021 13.9 12.0 - 15.0 g/dL Final   HCT  Date Value Ref Range Status  12/09/2021 41.0 36.0 - 46.0 % Final   MCHC  Date Value Ref Range Status  12/09/2021 33.2 30.0 - 36.0 g/dL Final    [facility]  Date Value Ref Range Status  12/09/2021 35.5 (H) 26.0 - 34.0 pg Final   MCV  Date Value Ref Range Status  12/09/2021 107.2 (H) 80.0 - 100.0 fL Final   No results found for: "PLTCOUNTKUC", "LABPLAT", "POCPLA" RDW  Date Value Ref Range Status  12/09/2021 13.0 11.5 - 15.5 % Final         Failed - CMP within normal limits and completed in the last 12 months    Albumin  Date Value Ref Range Status  12/09/2021 5.0 3.5 - 5.0 g/dL Final   Alkaline Phosphatase  Date Value Ref Range Status  12/09/2021 42 38 - 126 U/L Final   ALT  Date Value Ref Range Status  12/09/2021 105 (H) 0 - 44 U/L Final   AST  Date Value Ref Range Status  12/09/2021 122 (H) 15 - 41 U/L Final   BUN  Date Value Ref Range Status  12/09/2021 9 6 - 20 mg/dL Final   Calcium  Date Value Ref Range Status  12/09/2021 9.9 8.9 - 10.3 mg/dL Final   Calcium, Ion  Date Value Ref Range Status  12/09/2021 1.13 (L) 1.15 - 1.40 mmol/L Final   CO2  Date Value Ref Range Status  12/09/2021 17 (L) 22 - 32 mmol/L Final   Bicarbonate  Date Value Ref Range Status  12/09/2021 13.6 (L) 20.0 - 28.0 mmol/L Final   TCO2  Date Value Ref Range Status  12/09/2021 14 (L) 22 - 32 mmol/L Final   Creatinine, Ser  Date Value Ref Range Status  12/09/2021 0.93 0.44 - 1.00 mg/dL Final   Glucose, Bld  Date Value Ref Range Status  12/09/2021 90 70 - 99 mg/dL Final    Comment:    Glucose reference range applies only to samples taken after fasting for at least 8 hours.   Potassium  Date Value Ref Range Status  12/09/2021 4.8 3.5 - 5.1 mmol/L Final   Sodium  Date Value Ref Range Status  12/09/2021 134 (L) 135 - 145 mmol/L Final   Total Bilirubin  Date Value Ref Range Status  12/09/2021 1.4 (H) 0.3 - 1.2 mg/dL Final   Protein, ur  Date Value Ref Range Status  12/09/2021 30 (A) NEGATIVE mg/dL Final   Total Protein  Date Value Ref Range Status  12/09/2021 7.7 6.5 - 8.1 g/dL Final   GFR calc Af Amer  Date  Value Ref Range Status  07/12/2019 >60 >60 mL/min Final   GFR, Estimated  Date Value Ref Range Status  12/09/2021 >60 >60 mL/min Final    Comment:    (NOTE) Calculated using the CKD-EPI Creatinine Equation (2021)          Passed - Last BP in normal range    BP Readings from Last 1 Encounters:  08/11/23 105/68         Passed - Last Heart Rate in normal range    Pulse Readings from Last 1 Encounters:  08/11/23 90         Passed - Valid encounter within last 6 months    Recent Outpatient Visits           1 week ago Encounter to establish care   [facility], Amy J, NP               traZODone (DESYREL) 100 MG tablet      Sig: Take 0.5-1 tablets (50-100 mg total) by mouth daily as needed.     Psychiatry: Antidepressants - Serotonin Modulator Passed - 08/24/2023 10:21 AM      Passed - Valid encounter within last 6 months    Recent Outpatient Visits           1 week ago Encounter to establish care   [facility], Salomon Fick, NP

## 2023-08-25 ENCOUNTER — Other Ambulatory Visit: Payer: Self-pay | Admitting: Family

## 2023-08-25 DIAGNOSIS — F32A Depression, unspecified: Secondary | ICD-10-CM

## 2023-08-25 DIAGNOSIS — F4310 Post-traumatic stress disorder, unspecified: Secondary | ICD-10-CM

## 2023-08-25 DIAGNOSIS — G4700 Insomnia, unspecified: Secondary | ICD-10-CM

## 2023-08-25 MED ORDER — QUETIAPINE FUMARATE 100 MG PO TABS: 100.0000 mg | ORAL_TABLET | Freq: Every day | ORAL | 1 refills | Status: DC

## 2023-08-25 NOTE — Telephone Encounter (Signed)
 Complete

## 2023-09-07 ENCOUNTER — Telehealth: Payer: Self-pay | Admitting: Family

## 2023-09-07 NOTE — Telephone Encounter (Signed)
 Patient called and advised seroquel 100 mg was sent to the pharmacy on 08/25/23. She says she did pick it up, but she takes 2 at bedtime not 1, so she will be out of medication after tonight. She will need a new Rx sent to say take 2 a day. Advised I will send this to the provider for a new Rx to be sent. She says this was discussed at the visit.   Copied from CRM [id number]. Topic: Clinical - Medication Refill >> Sep 07, 2023  2:29 PM Louie Casa B wrote: Most Recent Primary Care Visit:  Provider: Ricky Stabs J  Department: [facility]  Visit Type: NEW PATIENT  Date: 08/11/2023  Medication: QUEtiapine (SEROQUEL) 100 MG  Has the patient contacted their pharmacy? Yes (Agent: If no, request that the patient contact the pharmacy for the refill. If patient does not wish to contact the pharmacy document the reason why and proceed with request.) (Agent: If yes, when and what did the pharmacy advise?)  Is this the correct pharmacy for this prescription? Yes If no, delete pharmacy and type the correct one.  This is the patient's preferred pharmacy:  [facility] #[id number] [place], [place] - [phone] AT [facility] OF [facility] & [address] [place] [address] Phone: [phone] Fax: [phone]   Has the prescription been filled recently? Yes  Is the patient out of the medication? Yes  Has the patient been seen for an appointment in the last year OR does the patient have an upcoming appointment? Yes  Can we respond through MyChart? No  Agent: Please be advised that Rx refills may take up to 3 business days. We ask that you follow-up with your pharmacy.

## 2023-09-08 NOTE — Telephone Encounter (Signed)
 Dr Andrey Campanile, Quetiapine 100 mg (1 tablet) daily at bedtime prescribed on 08/25/2023. Please advise if appropriate to increase Quetiapine to 200 mg (2 tablets) daily at bedtime per patient's request. Please note Quetiapine dose increase was not discussed during office visit to establish care on 08/11/2023.  Thank you.

## 2023-09-12 ENCOUNTER — Telehealth: Payer: Self-pay

## 2023-09-12 NOTE — Telephone Encounter (Signed)
 Reason for CRM: patient called stated her script for QUEtiapine (SEROQUEL) says to take 1 tablet by mouth at bedtime but she had been taking 2 tablets to go to sleep and now she has ran out of medication. Please advise patient as she is requesting another refill.

## 2023-09-12 NOTE — Telephone Encounter (Signed)
 Patient is asking for next prescription to be sent in for 100mg  2 tablets by mouth for sleeping per her old prescription.

## 2023-09-12 NOTE — Telephone Encounter (Signed)
 Please refer to supervising physician Georganna Skeans, MD advisement from 09/08/23 11:09 AM.

## 2023-09-12 NOTE — Telephone Encounter (Signed)
 Spoke to patient again, she has an appointment tomorrow. Said she is getting a prinout of her past medications to bring it to try and get it switched.

## 2023-09-13 ENCOUNTER — Ambulatory Visit (INDEPENDENT_AMBULATORY_CARE_PROVIDER_SITE_OTHER): Payer: BC Managed Care – PPO | Admitting: Family

## 2023-09-13 VITALS — BP 121/81 | HR 72 | Temp 98.8°F | Ht 65.0 in | Wt 127.2 lb

## 2023-09-13 DIAGNOSIS — Z1159 Encounter for screening for other viral diseases: Secondary | ICD-10-CM

## 2023-09-13 DIAGNOSIS — Z124 Encounter for screening for malignant neoplasm of cervix: Secondary | ICD-10-CM

## 2023-09-13 DIAGNOSIS — G4700 Insomnia, unspecified: Secondary | ICD-10-CM

## 2023-09-13 DIAGNOSIS — Z114 Encounter for screening for human immunodeficiency virus [HIV]: Secondary | ICD-10-CM

## 2023-09-13 DIAGNOSIS — F419 Anxiety disorder, unspecified: Secondary | ICD-10-CM | POA: Diagnosis not present

## 2023-09-13 DIAGNOSIS — Z13228 Encounter for screening for other metabolic disorders: Secondary | ICD-10-CM

## 2023-09-13 DIAGNOSIS — Z131 Encounter for screening for diabetes mellitus: Secondary | ICD-10-CM | POA: Diagnosis not present

## 2023-09-13 DIAGNOSIS — F4310 Post-traumatic stress disorder, unspecified: Secondary | ICD-10-CM

## 2023-09-13 DIAGNOSIS — Z800 Family history of malignant neoplasm of digestive organs: Secondary | ICD-10-CM

## 2023-09-13 DIAGNOSIS — Z1329 Encounter for screening for other suspected endocrine disorder: Secondary | ICD-10-CM

## 2023-09-13 DIAGNOSIS — Z13220 Encounter for screening for lipoid disorders: Secondary | ICD-10-CM

## 2023-09-13 DIAGNOSIS — F32A Depression, unspecified: Secondary | ICD-10-CM

## 2023-09-13 DIAGNOSIS — Z130 Encounter for screening for diseases of the blood and blood-forming organs and certain disorders involving the immune mechanism: Secondary | ICD-10-CM | POA: Diagnosis not present

## 2023-09-13 DIAGNOSIS — Z8049 Family history of malignant neoplasm of other genital organs: Secondary | ICD-10-CM

## 2023-09-13 DIAGNOSIS — Z0000 Encounter for general adult medical examination without abnormal findings: Secondary | ICD-10-CM

## 2023-09-13 MED ORDER — QUETIAPINE FUMARATE 100 MG PO TABS: 200.0000 mg | ORAL_TABLET | Freq: Every day | ORAL | 2 refills | Status: DC

## 2023-09-13 NOTE — Telephone Encounter (Signed)
 Patient has appt on 09/13/2023

## 2023-09-13 NOTE — Progress Notes (Signed)
 Patient states she wants to discuss medications.   Asking for psychiatrist referral.

## 2023-09-13 NOTE — Progress Notes (Signed)
 Patient ID: Mariah Bradley, female    DOB: 07/05/1995  MRN: [id number]  CC: Annual Exam  Subjective: Mariah Bradley is a 28 y.o. female who presents for annual exam.   Her concerns today include:  - States since previous office visit she returned missed call to Psychiatry referral and they did not answer. States she was previously prescribed Quetiapine 200 mg daily at bedtime and needs refills. She denies thoughts of self-harm, suicidal ideations, homicidal ideations.   There are no active problems to display for this patient.    Current Outpatient Medications on File Prior to Visit  Medication Sig Dispense Refill   busPIRone (BUSPAR) 10 MG tablet Take 1 tablet (10 mg total) by mouth 3 (three) times daily. When able to remember 90 tablet 1   SEROQUEL 100 MG tablet Take 1 tablet (100 mg total) by mouth at bedtime. 30 tablet 1   traZODone (DESYREL) 100 MG tablet Take 0.5-1 tablets (50-100 mg total) by mouth at bedtime as needed. 30 tablet 1   cyclobenzaprine (FLEXERIL) 10 MG tablet Take 1 tablet (10 mg total) by mouth 2 (two) times daily as needed for muscle spasms. (Patient not taking: Reported on 09/13/2023) 20 tablet 0   hydrOXYzine (ATARAX/VISTARIL) 25 MG tablet Take 1 tablet (25 mg total) by mouth every 6 (six) hours as needed for itching. (Patient not taking: Reported on 02/20/2015) 30 tablet 0   hydrOXYzine (VISTARIL) 50 MG capsule Take 50 mg by mouth at bedtime as needed (sleep). (Patient not taking: Reported on 09/13/2023)     ibuprofen (ADVIL,MOTRIN) 600 MG tablet Take 1 tablet (600 mg total) by mouth every 6 (six) hours as needed. (Patient not taking: Reported on 09/13/2023) 30 tablet 0   methocarbamol (ROBAXIN) 500 MG tablet Take 2 tablets (1,000 mg total) by mouth every 8 (eight) hours as needed for muscle spasms. (Patient not taking: Reported on 09/13/2023) 30 tablet 0   ondansetron (ZOFRAN) 4 MG tablet Take 1 tablet (4 mg total) by mouth every 6 (six) hours. (Patient not taking:  Reported on 09/13/2023) 12 tablet 0   ondansetron (ZOFRAN-ODT) 4 MG disintegrating tablet Take 1 tablet (4 mg total) by mouth every 8 (eight) hours as needed for nausea or vomiting. (Patient not taking: Reported on 09/13/2023) 20 tablet 0   oxyCODONE-acetaminophen (PERCOCET/ROXICET) 5-325 MG tablet Take 1 tablet by mouth every 8 (eight) hours as needed for severe pain. (Patient not taking: Reported on 09/13/2023) 6 tablet 0   pantoprazole (PROTONIX) 20 MG tablet Take 1 tablet (20 mg total) by mouth daily. (Patient not taking: Reported on 09/13/2023) 30 tablet 0   predniSONE (DELTASONE) 20 MG tablet 3 daily for 5 days, 2 daily for 5 days, 1 daily for 5 days. (Patient not taking: Reported on 02/20/2015) 30 tablet 0   pseudoephedrine (SUDAFED) 60 MG tablet Take 1 tablet (60 mg total) by mouth every 6 (six) hours as needed for congestion. (Patient not taking: Reported on 02/20/2015) 30 tablet 0   triamcinolone cream (KENALOG) 0.1 % Apply topically 3 (three) times daily. (Patient not taking: Reported on 02/20/2015) 454 g 0   No current facility-administered medications on file prior to visit.    Allergies  Allergen Reactions   Calamine     unsure    Social History   Socioeconomic History   Marital status: Single    Spouse name: Not on file   Number of children: Not on file   Years of education: Not on file  Highest education level: Not on file  Occupational History   Not on file  Tobacco Use   Smoking status: Never   Smokeless tobacco: Never  Vaping Use   Vaping status: Never Used  Substance and Sexual Activity   Alcohol use: Yes   Drug use: Not Currently    Types: Marijuana   Sexual activity: Not on file  Other Topics Concern   Not on file  Social History Narrative   Not on file   Social Drivers of Health   Financial Resource Strain: Low Risk  (08/11/2023)   Overall Financial Resource Strain (CARDIA)    Difficulty of Paying Living Expenses: Not hard at all  Food Insecurity: Not on file   Transportation Needs: Not on file  Physical Activity: Inactive (08/11/2023)   Exercise Vital Sign    Days of Exercise per Week: 0 days    Minutes of Exercise per Session: 0 min  Stress: Stress Concern Present (08/11/2023)   [employer] of Occupational Health - Occupational Stress Questionnaire    Feeling of Stress : Very much  Social Connections: Socially Isolated (08/11/2023)   Social Connection and Isolation Panel [NHANES]    Frequency of Communication with Friends and Family: Once a week    Frequency of Social Gatherings with Friends and Family: Once a week    Attends Religious Services: Never    Database administrator or Organizations: No    Attends Banker Meetings: Never    Marital Status: Never married  Intimate Partner Violence: Not At Risk (08/11/2023)   Humiliation, Afraid, Rape, and Kick questionnaire    Fear of Current or Ex-Partner: No    Emotionally Abused: No    Physically Abused: No    Sexually Abused: No    Family History  Problem Relation Age of Onset   Stroke Mother    Diabetes Father     Past Surgical History:  Procedure Laterality Date   TONSILLECTOMY      ROS: Review of Systems Negative except as stated above  PHYSICAL EXAM: BP 121/81   Pulse 72   Temp 98.8 F (37.1 C) (Oral)   Ht 5\' 5"  (1.651 m)   Wt 127 lb 3.2 oz (57.7 kg)   LMP 08/12/2023 (Exact Date)   SpO2 98%   BMI 21.17 kg/m   Physical Exam HENT:     Head: Normocephalic and atraumatic.     Right Ear: Tympanic membrane, ear canal and external ear normal.     Left Ear: Tympanic membrane, ear canal and external ear normal.     Nose: Nose normal.     Mouth/Throat:     Mouth: Mucous membranes are moist.     Pharynx: Oropharynx is clear.  Eyes:     Extraocular Movements: Extraocular movements intact.     Conjunctiva/sclera: Conjunctivae normal.     Pupils: Pupils are equal, round, and reactive to light.  Neck:     Thyroid: No thyroid mass, thyromegaly or thyroid  tenderness.  Cardiovascular:     Rate and Rhythm: Normal rate and regular rhythm.     Pulses: Normal pulses.     Heart sounds: Normal heart sounds.  Pulmonary:     Effort: Pulmonary effort is normal.     Breath sounds: Normal breath sounds.  Chest:     Comments: Patient declined. Abdominal:     General: Bowel sounds are normal.     Palpations: Abdomen is soft.  Genitourinary:    Comments: Patient declined. Musculoskeletal:  General: Normal range of motion.     Right shoulder: Normal.     Left shoulder: Normal.     Right upper arm: Normal.     Left upper arm: Normal.     Right elbow: Normal.     Left elbow: Normal.     Right forearm: Normal.     Left forearm: Normal.     Right wrist: Normal.     Left wrist: Normal.     Right hand: Normal.     Left hand: Normal.     Cervical back: Normal, normal range of motion and neck supple.     Thoracic back: Normal.     Lumbar back: Normal.     Right hip: Normal.     Left hip: Normal.     Right upper leg: Normal.     Left upper leg: Normal.     Right knee: Normal.     Left knee: Normal.     Right lower leg: Normal.     Left lower leg: Normal.     Right ankle: Normal.     Left ankle: Normal.     Right foot: Normal.     Left foot: Normal.  Skin:    General: Skin is warm and dry.     Capillary Refill: Capillary refill takes less than 2 seconds.  Neurological:     General: No focal deficit present.     Mental Status: She is alert and oriented to person, place, and time.  Psychiatric:        Mood and Affect: Mood normal.        Behavior: Behavior normal.     ASSESSMENT AND PLAN: 1. Annual physical exam (Primary) - Counseled on 150 minutes of exercise per week as tolerated, healthy eating (including decreased daily intake of saturated fats, cholesterol, added sugars, sodium), STI prevention, and routine healthcare maintenance.  2. Screening for metabolic disorder - Routine screening.  - CMP14+EGFR  3. Screening for  deficiency anemia - Routine screening.  - CBC  4. Diabetes mellitus screening - Routine screening.  - Hemoglobin A1c  5. Screening cholesterol level - Routine screening.  - Lipid panel  6. Thyroid disorder screen - Routine screening.  - TSH  7. Cervical cancer screening 8. Family history of uterine cancer - Referral to Gynecology for evaluation/management. - Ambulatory referral to Gynecology  9. Encounter for screening for HIV - Routine screening.  - HIV antibody (with reflex)  10. Need for hepatitis C screening test - Routine screening.  - Hepatitis C Antibody  11. Anxiety and depression 12. Insomnia, unspecified type 13. PTSD (post-traumatic stress disorder) - Patient denies thoughts of self-harm, suicidal ideations, homicidal ideations. - Continue Quetiapine as prescribed. Counseled on medication adherence/adverse effects. - Referral to Psychiatry for evaluation/management.  - Follow-up with primary provider as scheduled. - Ambulatory referral to Psychiatry - QUEtiapine (SEROQUEL) 100 MG tablet; Take 2 tablets (200 mg total) by mouth at bedtime.  Dispense: 60 tablet; Refill: 2  14. Family history of colon cancer - Referral to Gastroenterology for evaluation/management. - Ambulatory referral to Gastroenterology   Patient was given the opportunity to ask questions.  Patient verbalized understanding of the plan and was able to repeat key elements of the plan. Patient was given clear instructions to go to Emergency Department or return to medical center if symptoms don't improve, worsen, or new problems develop.The patient verbalized understanding.   Orders Placed This Encounter  Procedures   HIV antibody (with reflex)  Hepatitis C Antibody   CBC   Lipid panel   CMP14+EGFR   Hemoglobin A1c   TSH   Ambulatory referral to Gynecology   Ambulatory referral to Psychiatry   Ambulatory referral to Gastroenterology     Requested Prescriptions   Signed  Prescriptions Disp Refills   QUEtiapine (SEROQUEL) 100 MG tablet 60 tablet 2    Sig: Take 2 tablets (200 mg total) by mouth at bedtime.    Return in about 1 year (around 09/12/2024) for Physical per patient preference.  Rema Fendt, NP

## 2023-09-14 ENCOUNTER — Encounter: Payer: Self-pay | Admitting: Family

## 2023-09-14 ENCOUNTER — Other Ambulatory Visit: Payer: Self-pay | Admitting: Family

## 2023-09-14 DIAGNOSIS — Z13220 Encounter for screening for lipoid disorders: Secondary | ICD-10-CM

## 2023-09-14 LAB — CBC
Hematocrit: 41 % (ref 34.0–46.6)
Hemoglobin: 13.6 g/dL (ref 11.1–15.9)
MCH: 34.3 pg — ABNORMAL HIGH (ref 26.6–33.0)
MCHC: 33.2 g/dL (ref 31.5–35.7)
MCV: 103 fL — ABNORMAL HIGH (ref 79–97)
Platelets: 283 10*3/uL (ref 150–450)
RBC: 3.97 x10E6/uL (ref 3.77–5.28)
RDW: 12.5 % (ref 11.7–15.4)
WBC: 3.1 10*3/uL — ABNORMAL LOW (ref 3.4–10.8)

## 2023-09-14 LAB — CMP14+EGFR
ALT: 21 IU/L (ref 0–32)
AST: 28 IU/L (ref 0–40)
Albumin: 5.2 g/dL — ABNORMAL HIGH (ref 4.0–5.0)
Alkaline Phosphatase: 70 IU/L (ref 44–121)
BUN/Creatinine Ratio: 5 — ABNORMAL LOW (ref 9–23)
BUN: 4 mg/dL — ABNORMAL LOW (ref 6–20)
Bilirubin Total: 0.4 mg/dL (ref 0.0–1.2)
CO2: 21 mmol/L (ref 20–29)
Calcium: 9.8 mg/dL (ref 8.7–10.2)
Chloride: 101 mmol/L (ref 96–106)
Creatinine, Ser: 0.79 mg/dL (ref 0.57–1.00)
Globulin, Total: 2.6 g/dL (ref 1.5–4.5)
Glucose: 93 mg/dL (ref 70–99)
Potassium: 4.3 mmol/L (ref 3.5–5.2)
Sodium: 139 mmol/L (ref 134–144)
Total Protein: 7.8 g/dL (ref 6.0–8.5)
eGFR: 104 mL/min/{1.73_m2} (ref >59)

## 2023-09-14 LAB — LIPID PANEL
Chol/HDL Ratio: 1.8 ratio (ref 0.0–4.4)
Cholesterol, Total: 214 mg/dL — ABNORMAL HIGH (ref 100–199)
HDL: 120 mg/dL (ref >39)
LDL Chol Calc (NIH): 86 mg/dL (ref 0–99)
Triglycerides: 41 mg/dL (ref 0–149)
VLDL Cholesterol Cal: 8 mg/dL (ref 5–40)

## 2023-09-14 LAB — TSH: TSH: 1.25 u[IU]/mL (ref 0.450–4.500)

## 2023-09-14 LAB — HEMOGLOBIN A1C
Est. average glucose Bld gHb Est-mCnc: 105 mg/dL
Hgb A1c MFr Bld: 5.3 % (ref 4.8–5.6)

## 2023-09-14 LAB — HIV ANTIBODY (ROUTINE TESTING W REFLEX): HIV Screen 4th Generation wRfx: NONREACTIVE

## 2023-09-14 LAB — HEPATITIS C ANTIBODY: Hep C Virus Ab: NONREACTIVE

## 2023-10-18 ENCOUNTER — Other Ambulatory Visit

## 2023-10-27 ENCOUNTER — Ambulatory Visit
Admission: EM | Admit: 2023-10-27 | Discharge: 2023-10-27 | Disposition: A | Discharge status: Home / Self Care | Attending: Family Medicine | Admitting: Family Medicine

## 2023-10-27 ENCOUNTER — Other Ambulatory Visit: Payer: Self-pay

## 2023-10-27 ENCOUNTER — Emergency Department (HOSPITAL_COMMUNITY)
Admission: EM | Admit: 2023-10-27 | Discharge: 2023-10-27 | Disposition: A | Discharge status: Home / Self Care | Attending: Emergency Medicine | Admitting: Emergency Medicine

## 2023-10-27 DIAGNOSIS — R109 Unspecified abdominal pain: Secondary | ICD-10-CM | POA: Diagnosis not present

## 2023-10-27 DIAGNOSIS — R112 Nausea with vomiting, unspecified: Secondary | ICD-10-CM | POA: Diagnosis not present

## 2023-10-27 DIAGNOSIS — K859 Acute pancreatitis without necrosis or infection, unspecified: Secondary | ICD-10-CM | POA: Insufficient documentation

## 2023-10-27 LAB — COMPREHENSIVE METABOLIC PANEL WITH GFR
ALT: 26 U/L (ref 0–44)
AST: 44 U/L — ABNORMAL HIGH (ref 15–41)
Albumin: 4.9 g/dL (ref 3.5–5.0)
Alkaline Phosphatase: 59 U/L (ref 38–126)
Anion gap: 12 (ref 5–15)
BUN: 6 mg/dL (ref 6–20)
CO2: 22 mmol/L (ref 22–32)
Calcium: 9.5 mg/dL (ref 8.9–10.3)
Chloride: 102 mmol/L (ref 98–111)
Creatinine, Ser: 0.85 mg/dL (ref 0.44–1.00)
GFR, Estimated: >60 mL/min (ref >60)
Glucose, Bld: 132 mg/dL — ABNORMAL HIGH (ref 70–99)
Potassium: 3.4 mmol/L — ABNORMAL LOW (ref 3.5–5.1)
Sodium: 136 mmol/L (ref 135–145)
Total Bilirubin: 1.2 mg/dL (ref 0.0–1.2)
Total Protein: 8.1 g/dL (ref 6.5–8.1)

## 2023-10-27 LAB — CBC
HCT: 37.5 % (ref 36.0–46.0)
Hemoglobin: 13.5 g/dL (ref 12.0–15.0)
MCH: 36.6 pg — ABNORMAL HIGH (ref 26.0–34.0)
MCHC: 36 g/dL (ref 30.0–36.0)
MCV: 101.6 fL — ABNORMAL HIGH (ref 80.0–100.0)
Platelets: 301 10*3/uL (ref 150–400)
RBC: 3.69 MIL/uL — ABNORMAL LOW (ref 3.87–5.11)
RDW: 14.5 % (ref 11.5–15.5)
WBC: 5.9 10*3/uL (ref 4.0–10.5)
nRBC: 0 % (ref 0.0–0.2)

## 2023-10-27 LAB — LIPASE, BLOOD: Lipase: 275 U/L — ABNORMAL HIGH (ref 11–51)

## 2023-10-27 MED ORDER — ONDANSETRON HCL 4 MG/2ML IJ SOLN
4.0000 mg | Freq: Once | INTRAMUSCULAR | Status: AC
  Administered 2023-10-27: 4 mg via INTRAVENOUS
  Filled 2023-10-27: qty 2

## 2023-10-27 MED ORDER — OXYCODONE-ACETAMINOPHEN 5-325 MG PO TABS: 1.0000 | ORAL_TABLET | Freq: Three times a day (TID) | ORAL | 0 refills | Status: DC | PRN

## 2023-10-27 MED ORDER — OXYCODONE-ACETAMINOPHEN 5-325 MG PO TABS: 1.0000 | ORAL_TABLET | Freq: Three times a day (TID) | ORAL | 0 refills | Status: AC | PRN

## 2023-10-27 MED ORDER — ONDANSETRON HCL 4 MG/2ML IJ SOLN
4.0000 mg | Freq: Once | INTRAMUSCULAR | Status: AC
  Administered 2023-10-27: 4 mg via INTRAMUSCULAR

## 2023-10-27 MED ORDER — FENTANYL CITRATE PF 50 MCG/ML IJ SOSY
50.0000 ug | PREFILLED_SYRINGE | Freq: Once | INTRAMUSCULAR | Status: AC
  Administered 2023-10-27: 50 ug via INTRAVENOUS
  Filled 2023-10-27: qty 1

## 2023-10-27 MED ORDER — SODIUM CHLORIDE 0.9 % IV BOLUS
1000.0000 mL | Freq: Once | INTRAVENOUS | Status: AC
  Administered 2023-10-27: 1000 mL via INTRAVENOUS

## 2023-10-27 MED ORDER — ONDANSETRON 4 MG PO TBDP: 4.0000 mg | ORAL_TABLET | Freq: Three times a day (TID) | ORAL | 0 refills | Status: DC | PRN

## 2023-10-27 NOTE — ED Provider Notes (Signed)
 [place] EMERGENCY DEPARTMENT AT [facility] Provider Note   CSN: [id number] Arrival date & time: 10/27/23  1323     History  Chief Complaint  Patient presents with   Emesis    Mariah Bradley is a 28 y.o. female.   Emesis Patient with nausea vomiting abdominal pain.  Has had for the last few days.  History of alcoholic pancreatitis.  States this feels similar.  Decreased oral intake.  Pain is crampy.  Initially thought it may have been her menses but then pain change.  Difficulty with oral intake.    Past Medical History:  Diagnosis Date   Anxiety    Depression    PTSD (post-traumatic stress disorder)      Home Medications Prior to Admission medications   Medication Sig Start Date End Date Taking? Authorizing Provider  ondansetron  (ZOFRAN -ODT) 4 MG disintegrating tablet Take 1 tablet (4 mg total) by mouth every 8 (eight) hours as needed. 10/27/23  Yes Mozell Arias, MD  busPIRone  (BUSPAR ) 10 MG tablet Take 1 tablet (10 mg total) by mouth 3 (three) times daily. When able to remember 08/24/23   Senaida Dama, NP  cyclobenzaprine  (FLEXERIL ) 10 MG tablet Take 1 tablet (10 mg total) by mouth 2 (two) times daily as needed for muscle spasms. Patient not taking: Reported on 09/13/2023 04/19/16   Sherel Dikes, PA-C  hydrOXYzine  (ATARAX /VISTARIL ) 25 MG tablet Take 1 tablet (25 mg total) by mouth every 6 (six) hours as needed for itching. Patient not taking: Reported on 02/20/2015 11/30/12   Shanda Dark, MD  hydrOXYzine  (VISTARIL ) 50 MG capsule Take 50 mg by mouth at bedtime as needed (sleep). Patient not taking: Reported on 09/13/2023 09/23/21   [provider]  ibuprofen  (ADVIL ,MOTRIN ) 600 MG tablet Take 1 tablet (600 mg total) by mouth every 6 (six) hours as needed. Patient not taking: Reported on 09/13/2023 04/19/16   Sherel Dikes, PA-C  methocarbamol  (ROBAXIN ) 500 MG tablet Take 2 tablets (1,000 mg total) by mouth every 8 (eight) hours as needed for  muscle spasms. Patient not taking: Reported on 09/13/2023 08/25/15   Evone Hoh, MD  ondansetron  (ZOFRAN ) 4 MG tablet Take 1 tablet (4 mg total) by mouth every 6 (six) hours. Patient not taking: Reported on 09/13/2023 06/25/20   Burnette Carte, MD  oxyCODONE -acetaminophen  (PERCOCET/ROXICET) 5-325 MG tablet Take 1-2 tablets by mouth every 8 (eight) hours as needed for severe pain (pain score 7-10). 10/27/23   Mozell Arias, MD  pantoprazole  (PROTONIX ) 20 MG tablet Take 1 tablet (20 mg total) by mouth daily. Patient not taking: Reported on 09/13/2023 07/12/19   Muthersbaugh, Alisa App, PA-C  predniSONE  (DELTASONE ) 20 MG tablet 3 daily for 5 days, 2 daily for 5 days, 1 daily for 5 days. Patient not taking: Reported on 02/20/2015 11/30/12   Shanda Dark, MD  pseudoephedrine  (SUDAFED) 60 MG tablet Take 1 tablet (60 mg total) by mouth every 6 (six) hours as needed for congestion. Patient not taking: Reported on 02/20/2015 03/28/14   Arlee Lace, PA-C  QUEtiapine  (SEROQUEL ) 100 MG tablet Take 2 tablets (200 mg total) by mouth at bedtime. 09/13/23   Senaida Dama, NP  SEROQUEL  100 MG tablet Take 1 tablet (100 mg total) by mouth at bedtime. 08/24/23   Senaida Dama, NP  traZODone  (DESYREL ) 100 MG tablet Take 0.5-1 tablets (50-100 mg total) by mouth at bedtime as needed. 08/24/23   Senaida Dama, NP  triamcinolone  cream (KENALOG ) 0.1 % Apply topically  3 (three) times daily. Patient not taking: Reported on 02/20/2015 11/30/12   Shanda Dark, MD      Allergies    Calamine    Review of Systems   Review of Systems  Gastrointestinal:  Positive for vomiting.    Physical Exam Updated Vital Signs BP 133/87   Pulse (!) 105   Temp 97.8 F (36.6 C) (Oral)   Resp 18   Ht 5\' 5"  (1.651 m)   Wt 56.7 kg   LMP 10/25/2023 (Exact Date)   SpO2 100%   BMI 20.80 kg/m  Physical Exam Vitals and nursing note reviewed.  HENT:     Head: Normocephalic.  Cardiovascular:     Rate and Rhythm: Regular rhythm.   Abdominal:     Tenderness: There is abdominal tenderness.     Comments: Diffuse abdominal tenderness.  No rebound or guarding.  No hernia palpated.  Skin:    Capillary Refill: Capillary refill takes less than 2 seconds.  Neurological:     Mental Status: She is alert and oriented to person, place, and time.     ED Results / Procedures / Treatments   Labs (all labs ordered are listed, but only abnormal results are displayed) Labs Reviewed  LIPASE, BLOOD - Abnormal; Notable for the following components:      Result Value   Lipase 275 (*)    All other components within normal limits  COMPREHENSIVE METABOLIC PANEL WITH GFR - Abnormal; Notable for the following components:   Potassium 3.4 (*)    Glucose, Bld 132 (*)    AST 44 (*)    All other components within normal limits  CBC - Abnormal; Notable for the following components:   RBC 3.69 (*)    MCV 101.6 (*)    MCH 36.6 (*)    All other components within normal limits  URINALYSIS, ROUTINE W REFLEX MICROSCOPIC  HCG, SERUM, QUALITATIVE    EKG None  Radiology No results found.  Procedures Procedures    Medications Ordered in ED Medications  sodium chloride  0.9 % bolus 1,000 mL (0 mLs Intravenous Stopped 10/27/23 1553)  ondansetron  (ZOFRAN ) injection 4 mg (4 mg Intravenous Given 10/27/23 1436)  fentaNYL  (SUBLIMAZE ) injection 50 mcg (50 mcg Intravenous Given 10/27/23 1435)    ED Course/ Medical Decision Making/ A&P                                 Medical Decision Making Amount and/or Complexity of Data Reviewed Labs: ordered.  Risk Prescription drug management.   Patient abdominal pain.  Diffuse.  Differential diagnosis includes cause such obstruction, abscess, pancreatitis.  Patient's lipase is elevated.  Would go along with likely diagnosis of pancreatitis.  Will give fluids antiemetics and pain medicines.  Reviewed CT scan from previous pancreatitis episode.  CT scan not indicated at this time.  Lipase is  elevated which would go along with pancreatitis.  He has tolerated orals and is eager to go home.  Will treat symptomatically but appears stable.  Discharge        Final Clinical Impression(s) / ED Diagnoses Final diagnoses:  Acute pancreatitis, unspecified complication status, unspecified pancreatitis type    Rx / DC Orders ED Discharge Orders          Ordered    ondansetron  (ZOFRAN -ODT) 4 MG disintegrating tablet  Every 8 hours PRN        10/27/23 1605    oxyCODONE -acetaminophen  (  PERCOCET/ROXICET) 5-325 MG tablet  Every 8 hours PRN,   Status:  Discontinued        10/27/23 1605    oxyCODONE -acetaminophen  (PERCOCET/ROXICET) 5-325 MG tablet  Every 8 hours PRN        10/27/23 1606              Mozell Arias, MD 10/27/23 1608

## 2023-10-27 NOTE — ED Triage Notes (Signed)
 Pt came in for nausea and vomiting after leaving the urgent care. Pt told UC she's having the same symptoms when she had acute pancreatitis so they sent her to the ED. Pt stated she's been throwing up bile and stomach acid since 8am this morning.

## 2023-10-27 NOTE — Discharge Instructions (Addendum)
 I am concerned that you have a recurrent pancreatitis and require emergency level care. Please go straight to the hospital now.

## 2023-10-27 NOTE — ED Provider Notes (Signed)
 [facility] - URGENT CARE CENTER  Note:  This document was prepared using Conservation officer, historic buildings and may include unintentional dictation errors.  MRN: [id number] DOB: July 01, 1995  Subjective:   Mariah Bradley is a 28 y.o. female presenting for 1 day history of acute onset intractable nausea, vomiting, upper abdominal pain.  Patient last had a drink of alcohol 2 days ago and her symptoms started yesterday.  Prior to that she has been very careful about her alcohol consumption as she was diagnosed with pancreatitis in 2023.  She also smokes marijuana daily.  No fever, respiratory symptoms, bloody stools, diarrhea, constipation.  No rashes.  No history of GI disorders such as ulcerative colitis, Crohn disease, IBS.  No current facility-administered medications for this encounter.  Current Outpatient Medications:    busPIRone  (BUSPAR ) 10 MG tablet, Take 1 tablet (10 mg total) by mouth 3 (three) times daily. When able to remember, Disp: 90 tablet, Rfl: 1   cyclobenzaprine  (FLEXERIL ) 10 MG tablet, Take 1 tablet (10 mg total) by mouth 2 (two) times daily as needed for muscle spasms. (Patient not taking: Reported on 09/13/2023), Disp: 20 tablet, Rfl: 0   hydrOXYzine  (ATARAX /VISTARIL ) 25 MG tablet, Take 1 tablet (25 mg total) by mouth every 6 (six) hours as needed for itching. (Patient not taking: Reported on 02/20/2015), Disp: 30 tablet, Rfl: 0   hydrOXYzine  (VISTARIL ) 50 MG capsule, Take 50 mg by mouth at bedtime as needed (sleep). (Patient not taking: Reported on 09/13/2023), Disp: , Rfl:    ibuprofen  (ADVIL ,MOTRIN ) 600 MG tablet, Take 1 tablet (600 mg total) by mouth every 6 (six) hours as needed. (Patient not taking: Reported on 09/13/2023), Disp: 30 tablet, Rfl: 0   methocarbamol  (ROBAXIN ) 500 MG tablet, Take 2 tablets (1,000 mg total) by mouth every 8 (eight) hours as needed for muscle spasms. (Patient not taking: Reported on 09/13/2023), Disp: 30 tablet, Rfl: 0   ondansetron  (ZOFRAN ) 4 MG  tablet, Take 1 tablet (4 mg total) by mouth every 6 (six) hours. (Patient not taking: Reported on 09/13/2023), Disp: 12 tablet, Rfl: 0   ondansetron  (ZOFRAN -ODT) 4 MG disintegrating tablet, Take 1 tablet (4 mg total) by mouth every 8 (eight) hours as needed for nausea or vomiting. (Patient not taking: Reported on 09/13/2023), Disp: 20 tablet, Rfl: 0   oxyCODONE -acetaminophen  (PERCOCET/ROXICET) 5-325 MG tablet, Take 1 tablet by mouth every 8 (eight) hours as needed for severe pain. (Patient not taking: Reported on 09/13/2023), Disp: 6 tablet, Rfl: 0   pantoprazole  (PROTONIX ) 20 MG tablet, Take 1 tablet (20 mg total) by mouth daily. (Patient not taking: Reported on 09/13/2023), Disp: 30 tablet, Rfl: 0   predniSONE  (DELTASONE ) 20 MG tablet, 3 daily for 5 days, 2 daily for 5 days, 1 daily for 5 days. (Patient not taking: Reported on 02/20/2015), Disp: 30 tablet, Rfl: 0   pseudoephedrine  (SUDAFED) 60 MG tablet, Take 1 tablet (60 mg total) by mouth every 6 (six) hours as needed for congestion. (Patient not taking: Reported on 02/20/2015), Disp: 30 tablet, Rfl: 0   QUEtiapine  (SEROQUEL ) 100 MG tablet, Take 2 tablets (200 mg total) by mouth at bedtime., Disp: 60 tablet, Rfl: 2   SEROQUEL  100 MG tablet, Take 1 tablet (100 mg total) by mouth at bedtime., Disp: 30 tablet, Rfl: 1   traZODone  (DESYREL ) 100 MG tablet, Take 0.5-1 tablets (50-100 mg total) by mouth at bedtime as needed., Disp: 30 tablet, Rfl: 1   triamcinolone  cream (KENALOG ) 0.1 %, Apply topically 3 (three) times  daily. (Patient not taking: Reported on 02/20/2015), Disp: 454 g, Rfl: 0   Allergies  Allergen Reactions   Calamine     unsure    Past Medical History:  Diagnosis Date   Anxiety    Depression    PTSD (post-traumatic stress disorder)      Past Surgical History:  Procedure Laterality Date   TONSILLECTOMY      Family History  Problem Relation Age of Onset   Stroke Mother    Diabetes Father     Social History   Tobacco Use   Smoking  status: Never   Smokeless tobacco: Never  Vaping Use   Vaping status: Never Used  Substance Use Topics   Alcohol use: Yes   Drug use: Not Currently    Types: Marijuana    ROS   Objective:   Vitals: BP 137/85 (BP Location: Right Arm)   Pulse 67   Temp 97.8 F (36.6 C) (Oral)   Resp 16   LMP 10/25/2023 (Exact Date)   SpO2 96%   Physical Exam Constitutional:      General: She is in acute distress.     Appearance: Normal appearance. She is well-developed. She is not ill-appearing, toxic-appearing or diaphoretic.  HENT:     Head: Normocephalic and atraumatic.     Nose: Nose normal.     Mouth/Throat:     Mouth: Mucous membranes are moist.  Eyes:     General: No scleral icterus.       Right eye: No discharge.        Left eye: No discharge.     Extraocular Movements: Extraocular movements intact.     Conjunctiva/sclera: Conjunctivae normal.  Cardiovascular:     Rate and Rhythm: Normal rate.  Pulmonary:     Effort: Pulmonary effort is normal.  Abdominal:     General: Bowel sounds are increased. There is no distension.     Palpations: Abdomen is soft. There is no mass.     Tenderness: There is generalized abdominal tenderness and tenderness in the epigastric area. There is guarding. There is no right CVA tenderness, left CVA tenderness or rebound.  Skin:    General: Skin is warm and dry.  Neurological:     General: No focal deficit present.     Mental Status: She is alert and oriented to person, place, and time.  Psychiatric:        Mood and Affect: Mood normal.        Behavior: Behavior normal.        Thought Content: Thought content normal.        Judgment: Judgment normal.    IM Zofran  4 mg administered in clinic.  Assessment and Plan :   PDMP not reviewed this encounter.  1. Acute abdominal pain   2. Nausea and vomiting, unspecified vomiting type    Patient is in need of a higher level of care than we can provide in the urgent care setting.  This includes  rule out of an acute abdomen, acute pancreatitis, cannabinoid induced hyperemesis syndrome, acute kidney injury, among other acute intra-abdominal processes.  Discussed this with patient and her significant other.  She contracts for safety and will present to the emergency room now.  I advised that she be n.p.o. until further evaluated through the emergency room.   Adolph Hoop, PA-C 10/27/23 1252

## 2023-10-27 NOTE — ED Triage Notes (Addendum)
 Patient presents to UC for N/V since yesterday, states it has improved until today at 0800. Hx of acute pancreatitis when excessive alcohol consumption. States this is what it feels like.

## 2023-10-27 NOTE — ED Notes (Signed)
 Patient is being discharged from the Urgent Care and sent to the Emergency Department via POV with family. Per [place], [place], patient is in need of higher level of care due to rule out acute pancreatitis and AKI. Patient is aware and verbalizes understanding of plan of care.  Vitals:   10/27/23 1232  BP: 137/85  Pulse: 67  Resp: 16  Temp: 97.8 F (36.6 C)  SpO2: 96%

## 2023-11-02 ENCOUNTER — Encounter: Payer: Self-pay | Admitting: Family

## 2023-11-21 ENCOUNTER — Other Ambulatory Visit: Payer: Self-pay

## 2023-11-21 ENCOUNTER — Ambulatory Visit (INDEPENDENT_AMBULATORY_CARE_PROVIDER_SITE_OTHER): Admitting: Obstetrics and Gynecology

## 2023-11-21 ENCOUNTER — Encounter: Payer: Self-pay | Admitting: Obstetrics and Gynecology

## 2023-11-21 VITALS — BP 119/77 | HR 98

## 2023-11-21 DIAGNOSIS — Z113 Encounter for screening for infections with a predominantly sexual mode of transmission: Secondary | ICD-10-CM

## 2023-11-21 DIAGNOSIS — Z01419 Encounter for gynecological examination (general) (routine) without abnormal findings: Secondary | ICD-10-CM | POA: Diagnosis not present

## 2023-11-21 NOTE — Progress Notes (Signed)
 ANNUAL EXAM Patient name: MARLISS BUTTACAVOLI MRN [id number]  Date of birth: 1996/05/13 Chief Complaint:   Gynecologic Exam  History of Present Illness:   LAVANNA ROG is a 28 y.o. No obstetric history on file. being seen today for a routine annual exam.  Current complaints: annual  Menstrual concerns? No  monthly  Breast or nipple changes?   Contraception use? Yes non-receptive partner Sexually active? Yes is a non-receptive partner  Has had difficulty with pelvic exam due to history of trauma. States for exam to be completed, provider has to use a very small speculum - would prefer either anxiolytic or IVCS to complete pap smear in the future  Family of breast cancer in grandmother  Patient's last menstrual period was 10/25/2023 (exact date).  The pregnancy intention screening data noted above was reviewed. Potential methods of contraception were discussed. The patient elected to proceed with No data recorded.   Last pap No results found for: "DIAGPAP", "HPVHIGH", "ADEQPAP"reporst within the last 3 years, normal  Last mammogram: n/a.  Last colonoscopy: n/a.      09/13/2023    2:37 PM  Depression screen PHQ 2/9  Decreased Interest 2  Down, Depressed, Hopeless 2  PHQ - 2 Score 4  Altered sleeping 3  Tired, decreased energy 2  Change in appetite 2  Feeling bad or failure about yourself  1  Trouble concentrating 2  Moving slowly or fidgety/restless 1  Suicidal thoughts 0  PHQ-9 Score 15  Difficult doing work/chores Extremely dIfficult        09/13/2023    2:37 PM  GAD 7 : Generalized Anxiety Score  Nervous, Anxious, on Edge 3  Control/stop worrying 2  Worry too much - different things 2  Trouble relaxing 3  Restless 2  Easily annoyed or irritable 3  Afraid - awful might happen 2  Total GAD 7 Score 17  Anxiety Difficulty Extremely difficult     Review of Systems:   Pertinent items are noted in HPI Denies any headaches, blurred vision, fatigue, shortness of  breath, chest pain, abdominal pain, abnormal vaginal discharge/itching/odor/irritation, problems with periods, bowel movements, urination, or intercourse unless otherwise stated above. Pertinent History Reviewed:  Reviewed past medical,surgical, social and family history.  Reviewed problem list, medications and allergies. Physical Assessment:   Vitals:   11/21/23 1554  BP: 119/77  Pulse: 98  There is no height or weight on file to calculate BMI.        Physical Examination:   General appearance - well appearing, and in no distress  Mental status - alert, oriented to person, place, and time  Psych:  She has a normal mood and affect  Skin - warm and dry, normal color, no suspicious lesions noted  Chest - effort normal, all lung fields clear to auscultation bilaterally  Heart - normal rate and regular rhythm  Breasts - breasts appear normal, no suspicious masses, no skin or nipple changes or  axillary nodes  Abdomen - soft, nontender, nondistended, no masses or organomegaly  Pelvic - deferred  Extremities:  No swelling or varicosities noted  Chaperone present for exam  No results found for this or any previous visit (from the past 24 hours).    Assessment & Plan:  1. Well woman exam with routine gynecological exam (Primary) - Cervical cancer screening: Discussed screening Q3 years. Reviewed importance of annual exams and limits of pap smear. Pap with reflex HPV deferred for now > plan for next in 1 year  with either anxiolytic or under anesthesia due to history of trauma.  Discussed that if not engaging in any receptive sexual activities, decreased risk of exposure to HPV - GC/CT: Discussed and recommended. Pt  declines - Gardasil: completed - Birth Control: non-receptive partner - Breast Health: Encouraged self breast awareness/exams.  - Follow-up: 12 months and prn  ROI for prior records completed  2. Screening for STD (sexually transmitted disease)  - HIV Antibody (routine  testing w rflx) - RPR - Hepatitis B Surface AntiGEN - Hepatitis C Antibody   Orders Placed This Encounter  Procedures   HIV Antibody (routine testing w rflx)   RPR   Hepatitis B Surface AntiGEN   Hepatitis C Antibody    Meds: No orders of the defined types were placed in this encounter.   Follow-up: No follow-ups on file.  Kiki Pelton, MD 11/21/2023 1:12 PM

## 2023-11-22 ENCOUNTER — Ambulatory Visit: Payer: Self-pay | Admitting: Obstetrics and Gynecology

## 2023-11-22 LAB — HEPATITIS B SURFACE ANTIGEN: Hepatitis B Surface Ag: NEGATIVE

## 2023-11-22 LAB — HEPATITIS C ANTIBODY: Hep C Virus Ab: NONREACTIVE

## 2023-11-22 LAB — SYPHILIS: RPR W/REFLEX TO RPR TITER AND TREPONEMAL ANTIBODIES, TRADITIONAL SCREENING AND DIAGNOSIS ALGORITHM: RPR Ser Ql: NONREACTIVE

## 2023-11-22 LAB — HIV ANTIBODY (ROUTINE TESTING W REFLEX): HIV Screen 4th Generation wRfx: NONREACTIVE

## 2023-11-30 ENCOUNTER — Other Ambulatory Visit: Payer: Self-pay | Admitting: Family

## 2023-11-30 ENCOUNTER — Telehealth: Payer: Self-pay | Admitting: Family

## 2023-11-30 NOTE — Telephone Encounter (Signed)
 Copied from CRM [id number]. Topic: Referral - Question >> Nov 30, 2023 12:37 PM Mariah Bradley wrote: Reason for CRM: Patient requesting and update on psychiatry referral. She states she has attempted several times to schedule appointment.

## 2023-11-30 NOTE — Telephone Encounter (Unsigned)
 Copied from CRM [id number]. Topic: Clinical - Medication Refill >> Nov 30, 2023 12:35 PM Shardie S wrote: Medication: busPIRone  (BUSPAR ) 10 MG tablet  Has the patient contacted their pharmacy? Yes, contact PCP (Agent: If no, request that the patient contact the pharmacy for the refill. If patient does not wish to contact the pharmacy document the reason why and proceed with request.) (Agent: If yes, when and what did the pharmacy advise?)  This is the patient's preferred pharmacy:  [facility] #[id number] Jonette Nestle, [place] - [address] AT [facility] OF [facility] & [address] [place] [address] Phone: [phone] Fax: [phone]  Is this the correct pharmacy for this prescription? Yes If no, delete pharmacy and type the correct one.   Has the prescription been filled recently? No  Is the patient out of the medication? Yes  Has the patient been seen for an appointment in the last year OR does the patient have an upcoming appointment? Yes  Can we respond through MyChart? No  Agent: Please be advised that Rx refills may take up to 3 business days. We ask that you follow-up with your pharmacy.

## 2023-12-04 ENCOUNTER — Ambulatory Visit
Admission: EM | Admit: 2023-12-04 | Discharge: 2023-12-04 | Disposition: A | Discharge status: Home / Self Care | Attending: Physician Assistant | Admitting: Physician Assistant

## 2023-12-04 ENCOUNTER — Other Ambulatory Visit: Payer: Self-pay

## 2023-12-04 DIAGNOSIS — R112 Nausea with vomiting, unspecified: Secondary | ICD-10-CM | POA: Diagnosis not present

## 2023-12-04 DIAGNOSIS — Z8719 Personal history of other diseases of the digestive system: Secondary | ICD-10-CM

## 2023-12-04 DIAGNOSIS — R197 Diarrhea, unspecified: Secondary | ICD-10-CM

## 2023-12-04 MED ORDER — ONDANSETRON 4 MG PO TBDP: 4.0000 mg | ORAL_TABLET | Freq: Three times a day (TID) | ORAL | 0 refills | Status: DC | PRN

## 2023-12-04 MED ORDER — ONDANSETRON HCL 4 MG/2ML IJ SOLN
4.0000 mg | Freq: Once | INTRAMUSCULAR | Status: AC
Start: 2023-12-04 — End: 2023-12-04
  Administered 2023-12-04: 4 mg via INTRAMUSCULAR

## 2023-12-04 MED ORDER — BUSPIRONE HCL 10 MG PO TABS: 10.0000 mg | ORAL_TABLET | Freq: Three times a day (TID) | ORAL | 3 refills | Status: AC

## 2023-12-04 NOTE — Telephone Encounter (Signed)
 Pt taking medication 3x a day, due for refill.

## 2023-12-04 NOTE — Telephone Encounter (Signed)
 Call patient with update.

## 2023-12-04 NOTE — Telephone Encounter (Signed)
 [place], do we have any updates on patient's referral to Psychiatry? Thank you.

## 2023-12-04 NOTE — Addendum Note (Signed)
 Addended by: Richie Bonanno K on: 12/04/2023 10:57 AM   Modules accepted: Orders

## 2023-12-04 NOTE — ED Provider Notes (Signed)
 [facility]    CSN: [id number] Arrival date & time: 12/04/23  9173      History   Chief Complaint Chief Complaint  Patient presents with   Nausea   Emesis    HPI Mariah Bradley is a 28 y.o. female.   HPI  Pt presents today with concerns for nausea and vomiting that started this AM at approx 4 AM  She reports generalized abdominal pain and chills that occur after vomiting She states abdominal pain is 2/10  She reports recent hx of pancreatitis- reviewed chart from ED encounter on 10/27/23  She reports she has not been able to eat much since that dx and is not concerned for potential food poisoning or contamination today  Interventions: None   She reports nausea is limiting her PO intake. She has tried to sip water but this caused her to vomit. She has tried chewing ice chips to help    Past Medical History:  Diagnosis Date   Anxiety    Depression    PTSD (post-traumatic stress disorder)     There are no active problems to display for this patient.   Past Surgical History:  Procedure Laterality Date   TONSILLECTOMY      OB History   No obstetric history on file.      Home Medications    Prior to Admission medications   Medication Sig Start Date End Date Taking? Authorizing Provider  ondansetron  (ZOFRAN -ODT) 4 MG disintegrating tablet Take 1 tablet (4 mg total) by mouth every 8 (eight) hours as needed for nausea or vomiting. 12/04/23  Yes Korryn Pancoast E, PA-C  busPIRone  (BUSPAR ) 10 MG tablet Take 1 tablet (10 mg total) by mouth 3 (three) times daily. When able to remember 08/24/23   Lorren Greig PARAS, NP  cyclobenzaprine  (FLEXERIL ) 10 MG tablet Take 1 tablet (10 mg total) by mouth 2 (two) times daily as needed for muscle spasms. Patient not taking: Reported on 09/13/2023 04/19/16   Vicky Charleston, PA-C  hydrOXYzine  (ATARAX /VISTARIL ) 25 MG tablet Take 1 tablet (25 mg total) by mouth every 6 (six) hours as needed for itching. Patient not taking: Reported  on 02/20/2015 11/30/12   Sonda Alm BROCKS, MD  hydrOXYzine  (VISTARIL ) 50 MG capsule Take 50 mg by mouth at bedtime as needed (sleep). Patient not taking: Reported on 09/13/2023 09/23/21   [provider]  ibuprofen  (ADVIL ,MOTRIN ) 600 MG tablet Take 1 tablet (600 mg total) by mouth every 6 (six) hours as needed. Patient not taking: Reported on 09/13/2023 04/19/16   Vicky Charleston, PA-C  methocarbamol  (ROBAXIN ) 500 MG tablet Take 2 tablets (1,000 mg total) by mouth every 8 (eight) hours as needed for muscle spasms. Patient not taking: Reported on 09/13/2023 08/25/15   Carlyle Alm, MD  ondansetron  (ZOFRAN ) 4 MG tablet Take 1 tablet (4 mg total) by mouth every 6 (six) hours. Patient not taking: Reported on 09/13/2023 06/25/20   Laurice Maude BROCKS, MD  ondansetron  (ZOFRAN -ODT) 4 MG disintegrating tablet Take 1 tablet (4 mg total) by mouth every 8 (eight) hours as needed. 10/27/23   Patsey Lot, MD  oxyCODONE -acetaminophen  (PERCOCET/ROXICET) 5-325 MG tablet Take 1-2 tablets by mouth every 8 (eight) hours as needed for severe pain (pain score 7-10). 10/27/23   Patsey Lot, MD  pantoprazole  (PROTONIX ) 20 MG tablet Take 1 tablet (20 mg total) by mouth daily. Patient not taking: Reported on 09/13/2023 07/12/19   Muthersbaugh, Chiquita, PA-C  predniSONE  (DELTASONE ) 20 MG tablet 3 daily for 5 days,  2 daily for 5 days, 1 daily for 5 days. Patient not taking: Reported on 02/20/2015 11/30/12   Sonda Alm BROCKS, MD  pseudoephedrine  (SUDAFED) 60 MG tablet Take 1 tablet (60 mg total) by mouth every 6 (six) hours as needed for congestion. Patient not taking: Reported on 02/20/2015 03/28/14   Remonia Lacks, PA-C  QUEtiapine  (SEROQUEL ) 100 MG tablet Take 2 tablets (200 mg total) by mouth at bedtime. 09/13/23   Lorren Greig PARAS, NP  SEROQUEL  100 MG tablet Take 1 tablet (100 mg total) by mouth at bedtime. 08/24/23   Lorren Greig PARAS, NP  traZODone  (DESYREL ) 100 MG tablet Take 0.5-1 tablets (50-100 mg total) by mouth at  bedtime as needed. 08/24/23   Lorren Greig PARAS, NP  triamcinolone  cream (KENALOG ) 0.1 % Apply topically 3 (three) times daily. Patient not taking: Reported on 02/20/2015 11/30/12   Sonda Alm BROCKS, MD    Family History Family History  Problem Relation Age of Onset   Diabetes Paternal Grandmother    Breast cancer Paternal Grandmother    Diabetes Father    Stroke Mother    Endometriosis Mother    Cervical cancer Mother    Cancer Other     Social History Social History   Tobacco Use   Smoking status: Never   Smokeless tobacco: Never  Vaping Use   Vaping status: Never Used  Substance Use Topics   Alcohol use: Yes   Drug use: Not Currently    Types: Marijuana     Allergies   Calamine   Review of Systems Review of Systems  Constitutional:  Positive for chills. Negative for fever.  Gastrointestinal:  Positive for abdominal pain, diarrhea, nausea and vomiting. Negative for blood in stool.     Physical Exam Triage Vital Signs ED Triage Vitals  Encounter Vitals Group     BP 12/04/23 0838 100/64     Girls Systolic BP Percentile --      Girls Diastolic BP Percentile --      Boys Systolic BP Percentile --      Boys Diastolic BP Percentile --      Pulse Rate 12/04/23 0838 74     Resp 12/04/23 0838 18     Temp 12/04/23 0838 (!) 97.3 F (36.3 C)     Temp Source 12/04/23 0838 Temporal     SpO2 12/04/23 0838 99 %     Weight 12/04/23 0840 130 lb (59 kg)     Height 12/04/23 0840 5' 5 (1.651 m)     Head Circumference --      Peak Flow --      Pain Score 12/04/23 0840 2     Pain Loc --      Pain Education --      Exclude from Growth Chart --    No data found.  Updated Vital Signs BP 100/64 (BP Location: Right Arm)   Pulse 74   Temp (!) 97.3 F (36.3 C) (Temporal) Comment: unable to obtain orally, pt chewing on ice chips.  Resp 18   Ht 5' 5 (1.651 m)   Wt 130 lb (59 kg)   LMP 11/28/2023 (Exact Date)   SpO2 99%   BMI 21.63 kg/m   Visual Acuity Right Eye  Distance:   Left Eye Distance:   Bilateral Distance:    Right Eye Near:   Left Eye Near:    Bilateral Near:     Physical Exam Vitals reviewed.  Constitutional:      General: She  is awake. She is not in acute distress.    Appearance: She is well-developed and well-groomed. She is ill-appearing. She is not toxic-appearing.  HENT:     Head: Normocephalic and atraumatic.   Cardiovascular:     Rate and Rhythm: Normal rate and regular rhythm.     Heart sounds: Normal heart sounds. No murmur heard.    No friction rub. No gallop.  Pulmonary:     Effort: Pulmonary effort is normal.     Breath sounds: Normal breath sounds. No decreased air movement. No decreased breath sounds, wheezing, rhonchi or rales.  Abdominal:     General: Abdomen is flat. Bowel sounds are decreased.     Palpations: Abdomen is soft.     Tenderness: There is generalized abdominal tenderness. There is no guarding or rebound. Negative signs include Murphy's sign and McBurney's sign.   Musculoskeletal:     Cervical back: Normal range of motion.     Right lower leg: No edema.     Left lower leg: No edema.   Neurological:     General: No focal deficit present.     Mental Status: She is alert and oriented to person, place, and time.   Psychiatric:        Attention and Perception: Attention normal.        Mood and Affect: Mood and affect normal.        Speech: Speech normal.        Behavior: Behavior normal. Behavior is cooperative.        Cognition and Memory: Cognition normal.      UC Treatments / Results  Labs (all labs ordered are listed, but only abnormal results are displayed) Labs Reviewed - No data to display  EKG   Radiology No results found.  Procedures Procedures (including critical care time)  Medications Ordered in UC Medications  ondansetron  (ZOFRAN ) injection 4 mg (4 mg Intramuscular Given 12/04/23 0905)    Initial Impression / Assessment and Plan / UC Course  I have reviewed the  triage vital signs and the nursing notes.  Pertinent labs & imaging results that were available during my care of the patient were reviewed by me and considered in my medical decision making (see chart for details).      Final Clinical Impressions(s) / UC Diagnoses   Final diagnoses:  Nausea vomiting and diarrhea  Hx of acute pancreatitis   Patient presents today with concerns for nausea, vomiting, diarrhea since early this morning.  She reports that she has not been able to tolerate p.o. intake since vomiting occurred.  Of note patient has a previous history of pancreatitis for which she was seen in the ED on 10/27/2023.  I have  review this encounter information to assist with medical decision making.  Physical exam is notable for mild generalized abdominal tenderness.  Vitals are overall reassuring.  Zofran  4 mg injection provided to assist with nausea and vomiting.  After about 15 minutes following injection patient was able to tolerate drinking water without increased nausea or vomiting.  Will send patient home with Zofran  4 mg ODT to assist with further nausea and vomiting.  Given history of pancreatitis and recommend that she rehydrate with clear fluids over the next 1 to 2 days and then gradually progress diet to bland foods as tolerated.  ED and return precautions reviewed and provided in after visit summary.  Follow-up as needed    Discharge Instructions      You were seen today  for concerns of abdominal pain, nausea and vomiting.  We provided you with a Zofran  4 mg injection to help with your nausea and vomiting which seemed to provide relief Please continue to drink clear liquids for the next 1-2 days and slowly progress your diet as tolerated to bland foods then back to your normal diet.   I have also sent in a medication called Zofran  to assist with your nausea and vomiting.  You can take this as needed up to every 8 hours.  Please be advised that the most common side effect of  this medication is constipation.  If you start to develop the symptoms I recommend taking a few doses of a stool softener and increasing your fiber.  If needed you can also take a few doses of MiraLAX until you have regular bowel movements again.  If your symptoms are not responding to the Zofran , you develop increased abdominal pain, dehydration, fatigue, fever or chills, confusion, loss of consciousness please go to the ED for further evaluation and management.       ED Prescriptions     Medication Sig Dispense Auth. Provider   ondansetron  (ZOFRAN -ODT) 4 MG disintegrating tablet Take 1 tablet (4 mg total) by mouth every 8 (eight) hours as needed for nausea or vomiting. 20 tablet Swayzee Wadley E, PA-C      PDMP not reviewed this encounter.   Karine Garn, Rocky BRAVO, PA-C 12/04/23 1009

## 2023-12-04 NOTE — ED Notes (Signed)
 Cup of water given to patient per Hardin PA.

## 2023-12-04 NOTE — Telephone Encounter (Signed)
 Rx 08/24/23 #90 1RF- too soon Requested Prescriptions  Pending Prescriptions Disp Refills   busPIRone  (BUSPAR ) 10 MG tablet 90 tablet 1    Sig: Take 1 tablet (10 mg total) by mouth 3 (three) times daily. When able to remember     Psychiatry: Anxiolytics/Hypnotics - Non-controlled Passed - 12/04/2023 10:35 AM      Passed - Valid encounter within last 12 months    Recent Outpatient Visits           2 months ago Annual physical exam   [facility], Amy J, NP   3 months ago Encounter to establish care   [facility] at [facility], Greig PARAS, NP

## 2023-12-04 NOTE — Telephone Encounter (Signed)
 Requested Prescriptions  Pending Prescriptions Disp Refills   busPIRone  (BUSPAR ) 10 MG tablet 270 tablet 3    Sig: Take 1 tablet (10 mg total) by mouth 3 (three) times daily. When able to remember     Psychiatry: Anxiolytics/Hypnotics - Non-controlled Passed - 12/04/2023 11:14 AM      Passed - Valid encounter within last 12 months    Recent Outpatient Visits           2 months ago Annual physical exam   [facility], Amy J, NP   3 months ago Encounter to establish care   [facility] at [facility], Amy J, NP              Refused Prescriptions Disp Refills   busPIRone  (BUSPAR ) 10 MG tablet 90 tablet 1    Sig: Take 1 tablet (10 mg total) by mouth 3 (three) times daily. When able to remember     Psychiatry: Anxiolytics/Hypnotics - Non-controlled Passed - 12/04/2023 11:14 AM      Passed - Valid encounter within last 12 months    Recent Outpatient Visits           2 months ago Annual physical exam   [facility] at [facility], Amy J, NP   3 months ago Encounter to establish care   [facility] at [facility], Greig PARAS, NP

## 2023-12-04 NOTE — ED Triage Notes (Signed)
 Pt presents with complaints of nausea and vomiting since 4 AM this morning, 6/23. Pt states she is also having diarrhea and is lightheaded. Pt currently rates her overall pain a 2/10. Pain is in generalized abdomen, comes and goes. States she has been chewing on ice chips.

## 2023-12-04 NOTE — Discharge Instructions (Addendum)
 You were seen today for concerns of abdominal pain, nausea and vomiting.  We provided you with a Zofran  4 mg injection to help with your nausea and vomiting which seemed to provide relief Please continue to drink clear liquids for the next 1-2 days and slowly progress your diet as tolerated to bland foods then back to your normal diet.   I have also sent in a medication called Zofran  to assist with your nausea and vomiting.  You can take this as needed up to every 8 hours.  Please be advised that the most common side effect of this medication is constipation.  If you start to develop the symptoms I recommend taking a few doses of a stool softener and increasing your fiber.  If needed you can also take a few doses of MiraLAX until you have regular bowel movements again.  If your symptoms are not responding to the Zofran , you develop increased abdominal pain, dehydration, fatigue, fever or chills, confusion, loss of consciousness please go to the ED for further evaluation and management.

## 2023-12-05 ENCOUNTER — Telehealth: Payer: Self-pay

## 2023-12-05 NOTE — Telephone Encounter (Signed)
I called patient and left a voice mail to return my call.

## 2023-12-06 NOTE — Telephone Encounter (Signed)
 error

## 2023-12-06 NOTE — Telephone Encounter (Signed)
 I called patient and made her aware of PCP recommendations and I gave her the numbers to her referrals to call.

## 2024-01-09 ENCOUNTER — Other Ambulatory Visit: Payer: Self-pay | Admitting: Family

## 2024-01-09 NOTE — Telephone Encounter (Unsigned)
 Copied from CRM [id number]. Topic: Clinical - Medication Refill >> Jan 09, 2024 11:14 AM Emylou G wrote: Medication: traZODone  (DESYREL ) 100 MG tablet  Has the patient contacted their pharmacy? Yes (Agent: If no, request that the patient contact the pharmacy for the refill. If patient does not wish to contact the pharmacy document the reason why and proceed with request.) (Agent: If yes, when and what did the pharmacy advise?) they called  This is the patient's preferred pharmacy:  [facility] #[id number] [place] M[place], [place] - [address] AT [facility] OF [facility] & [address] [place] [address] Phone: [phone] Fax: [phone]  Is this the correct pharmacy for this prescription? Yes If no, delete pharmacy and type the correct one.   Has the prescription been filled recently? Yes  Is the patient out of the medication? Yes  Has the patient been seen for an appointment in the last year OR does the patient have an upcoming appointment? Yes  Can we respond through MyChart? No  Agent: Please be advised that Rx refills may take up to 3 business days. We ask that you follow-up with your pharmacy.

## 2024-01-10 MED ORDER — TRAZODONE HCL 100 MG PO TABS: 50.0000 mg | ORAL_TABLET | Freq: Every evening | ORAL | 1 refills | Status: AC | PRN

## 2024-01-10 NOTE — Telephone Encounter (Signed)
 Requested Prescriptions  Pending Prescriptions Disp Refills   traZODone  (DESYREL ) 100 MG tablet 30 tablet 1    Sig: Take 0.5-1 tablets (50-100 mg total) by mouth at bedtime as needed.     Psychiatry: Antidepressants - Serotonin Modulator Passed - 01/10/2024 12:41 PM      Passed - Valid encounter within last 6 months    Recent Outpatient Visits           3 months ago Annual physical exam   [facility], Amy J, NP   5 months ago Encounter to establish care   [facility] at [facility], Greig PARAS, NP

## 2024-01-25 ENCOUNTER — Other Ambulatory Visit: Payer: Self-pay | Admitting: Family

## 2024-01-25 DIAGNOSIS — F4310 Post-traumatic stress disorder, unspecified: Secondary | ICD-10-CM

## 2024-01-25 DIAGNOSIS — F32A Depression, unspecified: Secondary | ICD-10-CM

## 2024-01-25 DIAGNOSIS — G4700 Insomnia, unspecified: Secondary | ICD-10-CM

## 2024-01-31 NOTE — Telephone Encounter (Signed)
 Complete

## 2024-02-16 ENCOUNTER — Other Ambulatory Visit: Payer: Self-pay | Admitting: Family

## 2024-02-16 NOTE — Telephone Encounter (Signed)
 Requested medication (s) are due for refill today:   Provider to review  Requested medication (s) are on the active medication list:   Yes  Future visit scheduled:   Yes 09/12/2024 with Greig Drones, NP   Last ordered: 01/31/2024 #60, 2 refills  Non delegated refill     Requested Prescriptions  Pending Prescriptions Disp Refills   SEROQUEL  100 MG tablet 30 tablet 1    Sig: Take 1 tablet (100 mg total) by mouth at bedtime.     Not Delegated - Psychiatry:  Antipsychotics - Second Generation (Atypical) - quetiapine  Failed - 02/16/2024  3:06 PM      Failed - This refill cannot be delegated      Failed - Lipid Panel in normal range within the last 12 months    Cholesterol, Total  Date Value Ref Range Status  09/13/2023 214 (H) 100 - 199 mg/dL Final   LDL Chol Calc (NIH)  Date Value Ref Range Status  09/13/2023 86 0 - 99 mg/dL Final   HDL  Date Value Ref Range Status  09/13/2023 120 >39 mg/dL Final   Triglycerides  Date Value Ref Range Status  09/13/2023 41 0 - 149 mg/dL Final         Failed - CBC within normal limits and completed in the last 12 months    WBC  Date Value Ref Range Status  10/27/2023 5.9 4.0 - 10.5 K/uL Final   RBC  Date Value Ref Range Status  10/27/2023 3.69 (L) 3.87 - 5.11 MIL/uL Final   Hemoglobin  Date Value Ref Range Status  10/27/2023 13.5 12.0 - 15.0 g/dL Final  95/97/7974 86.3 11.1 - 15.9 g/dL Final   HCT  Date Value Ref Range Status  10/27/2023 37.5 36.0 - 46.0 % Final   Hematocrit  Date Value Ref Range Status  09/13/2023 41.0 34.0 - 46.6 % Final   MCHC  Date Value Ref Range Status  10/27/2023 36.0 30.0 - 36.0 g/dL Final   [facility]  Date Value Ref Range Status  10/27/2023 36.6 (H) 26.0 - 34.0 pg Final   MCV  Date Value Ref Range Status  10/27/2023 101.6 (H) 80.0 - 100.0 fL Final  09/13/2023 103 (H) 79 - 97 fL Final   No results found for: PLTCOUNTKUC, LABPLAT, POCPLA RDW  Date Value Ref Range Status  10/27/2023 14.5 11.5 -  15.5 % Final  09/13/2023 12.5 11.7 - 15.4 % Final         Passed - TSH in normal range and within 360 days    TSH  Date Value Ref Range Status  09/13/2023 1.250 0.450 - 4.500 uIU/mL Final         Passed - Last BP in normal range    BP Readings from Last 1 Encounters:  12/04/23 100/64         Passed - Last Heart Rate in normal range    Pulse Readings from Last 1 Encounters:  12/04/23 74         Passed - Valid encounter within last 6 months    Recent Outpatient Visits           5 months ago Annual physical exam   [facility], Amy J, NP   6 months ago Encounter to establish care   [facility], Greig PARAS, NP              Passed - CMP within normal  limits and completed in the last 12 months    Albumin  Date Value Ref Range Status  10/27/2023 4.9 3.5 - 5.0 g/dL Final  95/97/7974 5.2 (H) 4.0 - 5.0 g/dL Final   Alkaline Phosphatase  Date Value Ref Range Status  10/27/2023 59 38 - 126 U/L Final   ALT  Date Value Ref Range Status  10/27/2023 26 0 - 44 U/L Final   AST  Date Value Ref Range Status  10/27/2023 44 (H) 15 - 41 U/L Final   BUN  Date Value Ref Range Status  10/27/2023 6 6 - 20 mg/dL Final  95/97/7974 4 (L) 6 - 20 mg/dL Final   Calcium  Date Value Ref Range Status  10/27/2023 9.5 8.9 - 10.3 mg/dL Final   Calcium, Ion  Date Value Ref Range Status  12/09/2021 1.13 (L) 1.15 - 1.40 mmol/L Final   CO2  Date Value Ref Range Status  10/27/2023 22 22 - 32 mmol/L Final   Bicarbonate  Date Value Ref Range Status  12/09/2021 13.6 (L) 20.0 - 28.0 mmol/L Final   TCO2  Date Value Ref Range Status  12/09/2021 14 (L) 22 - 32 mmol/L Final   Creatinine, Ser  Date Value Ref Range Status  10/27/2023 0.85 0.44 - 1.00 mg/dL Final   Glucose, Bld  Date Value Ref Range Status  10/27/2023 132 (H) 70 - 99 mg/dL Final    Comment:    Glucose reference range applies only to samples taken  after fasting for at least 8 hours.   Potassium  Date Value Ref Range Status  10/27/2023 3.4 (L) 3.5 - 5.1 mmol/L Final   Sodium  Date Value Ref Range Status  10/27/2023 136 135 - 145 mmol/L Final  09/13/2023 139 134 - 144 mmol/L Final   Total Bilirubin  Date Value Ref Range Status  10/27/2023 1.2 0.0 - 1.2 mg/dL Final   Bilirubin Total  Date Value Ref Range Status  09/13/2023 0.4 0.0 - 1.2 mg/dL Final   Protein, ur  Date Value Ref Range Status  12/09/2021 30 (A) NEGATIVE mg/dL Final   Total Protein  Date Value Ref Range Status  10/27/2023 8.1 6.5 - 8.1 g/dL Final  95/97/7974 7.8 6.0 - 8.5 g/dL Final   GFR calc Af Amer  Date Value Ref Range Status  07/12/2019 >60 >60 mL/min Final   eGFR  Date Value Ref Range Status  09/13/2023 104 >59 mL/min/1.73 Final   GFR, Estimated  Date Value Ref Range Status  10/27/2023 >60 >60 mL/min Final    Comment:    (NOTE) Calculated using the CKD-EPI Creatinine Equation (2021)

## 2024-02-16 NOTE — Telephone Encounter (Signed)
 Copied from CRM [id number]. Topic: Clinical - Medication Refill >> Feb 16, 2024 11:06 AM Marissa P wrote: Medication: SEROQUEL  100 MG tablet  Has the patient contacted their pharmacy? Yes (Agent: If no, request that the patient contact the pharmacy for the refill. If patient does not wish to contact the pharmacy document the reason why and proceed with request.) (Agent: If yes, when and what did the pharmacy advise?)  This is the patient's preferred pharmacy:  [id number] [address], [place] - [address] AT [facility] & [address] [place] [address] Phone: [phone] Fax: [phone]  Is this the correct pharmacy for this prescription? Yes If no, delete pharmacy and type the correct one.   Has the prescription been filled recently? Yes  Is the patient out of the medication? Yes  Has the patient been seen for an appointment in the last year OR does the patient have an upcoming appointment? Yes  Can we respond through MyChart? Yes  Agent: Please be advised that Rx refills may take up to 3 business days. We ask that you follow-up with your pharmacy.

## 2024-02-16 NOTE — Telephone Encounter (Signed)
 Quetiapine  prescribed (01/31/2024  3:42 PM EDT).

## 2024-02-18 ENCOUNTER — Other Ambulatory Visit: Payer: Self-pay

## 2024-02-18 ENCOUNTER — Ambulatory Visit
Admission: EM | Admit: 2024-02-18 | Discharge: 2024-02-18 | Disposition: A | Discharge status: Home / Self Care | Attending: Physician Assistant | Admitting: Physician Assistant

## 2024-02-18 DIAGNOSIS — R112 Nausea with vomiting, unspecified: Secondary | ICD-10-CM | POA: Diagnosis not present

## 2024-02-18 DIAGNOSIS — R1012 Left upper quadrant pain: Secondary | ICD-10-CM

## 2024-02-18 LAB — POCT URINE PREGNANCY: Preg Test, Ur: NEGATIVE

## 2024-02-18 MED ORDER — ONDANSETRON 4 MG PO TBDP: 4.0000 mg | ORAL_TABLET | Freq: Three times a day (TID) | ORAL | 0 refills | Status: DC | PRN

## 2024-02-18 MED ORDER — ONDANSETRON 4 MG PO TBDP
4.0000 mg | ORAL_TABLET | Freq: Once | ORAL | Status: AC
  Administered 2024-02-18: 4 mg via ORAL

## 2024-02-18 NOTE — Discharge Instructions (Addendum)
 VISIT SUMMARY:  You came in today with diarrhea and abdominal pain. Your diarrhea started today and is characterized by light brown stools without blood. You also have left upper quadrant abdominal pain rated at 6 out of 10, which is less severe than your previous pancreatitis episodes. You have not been able to eat or drink much today and vomited after consuming a yogurt smoothie. Zofran  has helped reduce your nausea. You have a history of pancreatitis, with the last flare occurring approximately a year ago to your knowledge.  YOUR PLAN:  -SUSPECTED ACUTE PANCREATITIS: Acute pancreatitis is an inflammation of the pancreas that can cause abdominal pain, nausea, and diarrhea. We will order blood tests (CBC and electrolytes, lipase) to assess for pancreatitis. You should rest your bowel by consuming only clear liquids until your symptoms improve. Ibuprofen  and Acetaminophen  recommended for pain control as needed, and a refill of Zofran  is provided for nausea control. If your symptoms worsen or you are unable to manage them at home, please go to the emergency room.  -DEHYDRATION: Dehydration occurs when your body loses more fluids than it takes in, which can happen due to diarrhea and vomiting. We discussed the benefits of IV fluids for quicker rehydration at the emergency room. Please monitor for signs of dehydration and go to the emergency room if your symptoms worsen.  -NAUSEA AND VOMITING: Nausea and vomiting can prevent you from eating or drinking, leading to dehydration. Your nausea is partially controlled with Zofran . It is important to manage your nausea to prevent further dehydration. A refill of Zofran  is provided for nausea control. Please monitor your symptoms and seek emergency care if they worsen.  -DIARRHEA: Diarrhea is the frequent passage of loose, watery stools. Your diarrhea is light brown and clay-like without blood, contributing to dehydration and difficulty maintaining oral intake. You  should rest your bowel by consuming only clear liquids until your symptoms improve. Monitor for worsening symptoms and seek emergency care if necessary.  INSTRUCTIONS:  Please follow up with the recommended blood tests to assess for pancreatitis. Rest your bowel by consuming only clear liquids until your symptoms improve. Use ibuprofen  for pain control as needed and take Zofran  for nausea as prescribed. Monitor for signs of dehydration and worsening symptoms, and go to the emergency room if necessary: Fever that is not responding to Tylenol  ibuprofen , severe abdominal pain, nausea and vomiting preventing you from eating or drinking at all, confusion, loss of consciousness

## 2024-02-18 NOTE — ED Notes (Signed)
 When grabbing the urine sample from the pt's room she informed me that since her nausea has subsided some she is starting to have ULQ pain.  Informed Rocky, PA and Rosina, Charity fundraiser.

## 2024-02-18 NOTE — ED Provider Notes (Signed)
 [facility]    CSN: [id number] Arrival date & time: 02/18/24  1455      History   Chief Complaint Chief Complaint  Patient presents with   Diarrhea   Emesis    HPI Mariah Bradley is a 28 y.o. female.  has a past medical history of Anxiety, Depression, and PTSD (post-traumatic stress disorder).   HPI  Discussed the use of AI scribe software for clinical note transcription with the patient, who gave verbal consent to proceed. The patient, with a history of pancreatitis, presents with diarrhea and abdominal pain.  Diarrhea began today, characterized by light brown stools without blood. No fever or chills are present.  Left upper quadrant abdominal pain is rated as 6 out of 10, less severe than previous pancreatitis episodes but slightly aggravated. Relief is found by bending over or bringing knees up. No medication has been taken for the pain today.  She has not been able to eat or drink much today, having consumed only a yogurt smoothie, which she subsequently vomited. Zofran  has helped reduce her nausea.  She has a history of pancreatitis, with the last flare occurring approximately a year ago per her report. She has not engaged in any activities that might aggravate the condition to her knowledge.  Most recent episode of pancreatitis was 10/27/23 per chart review - reviewed notes and labs from this encounter     Past Medical History:  Diagnosis Date   Anxiety    Depression    PTSD (post-traumatic stress disorder)     There are no active problems to display for this patient.   Past Surgical History:  Procedure Laterality Date   TONSILLECTOMY      OB History   No obstetric history on file.      Home Medications    Prior to Admission medications   Medication Sig Start Date End Date Taking? Authorizing Provider  ondansetron  (ZOFRAN -ODT) 4 MG disintegrating tablet Take 1 tablet (4 mg total) by mouth every 8 (eight) hours as needed for nausea or  vomiting. 02/18/24  Yes Maela Takeda E, PA-C  busPIRone  (BUSPAR ) 10 MG tablet Take 1 tablet (10 mg total) by mouth 3 (three) times daily. When able to remember 12/04/23   Lorren Greig PARAS, NP  cyclobenzaprine  (FLEXERIL ) 10 MG tablet Take 1 tablet (10 mg total) by mouth 2 (two) times daily as needed for muscle spasms. Patient not taking: Reported on 09/13/2023 04/19/16   Vicky Charleston, PA-C  hydrOXYzine  (ATARAX /VISTARIL ) 25 MG tablet Take 1 tablet (25 mg total) by mouth every 6 (six) hours as needed for itching. Patient not taking: Reported on 02/20/2015 11/30/12   Sonda Alm BROCKS, MD  hydrOXYzine  (VISTARIL ) 50 MG capsule Take 50 mg by mouth at bedtime as needed (sleep). Patient not taking: Reported on 09/13/2023 09/23/21   [provider]  ibuprofen  (ADVIL ,MOTRIN ) 600 MG tablet Take 1 tablet (600 mg total) by mouth every 6 (six) hours as needed. Patient not taking: Reported on 09/13/2023 04/19/16   Vicky Charleston, PA-C  methocarbamol  (ROBAXIN ) 500 MG tablet Take 2 tablets (1,000 mg total) by mouth every 8 (eight) hours as needed for muscle spasms. Patient not taking: Reported on 09/13/2023 08/25/15   Carlyle Alm, MD  ondansetron  (ZOFRAN ) 4 MG tablet Take 1 tablet (4 mg total) by mouth every 6 (six) hours. Patient not taking: Reported on 09/13/2023 06/25/20   Laurice Maude BROCKS, MD  oxyCODONE -acetaminophen  (PERCOCET/ROXICET) 5-325 MG tablet Take 1-2 tablets by mouth every 8 (eight)  hours as needed for severe pain (pain score 7-10). 10/27/23   Patsey Lot, MD  pantoprazole  (PROTONIX ) 20 MG tablet Take 1 tablet (20 mg total) by mouth daily. Patient not taking: Reported on 09/13/2023 07/12/19   Muthersbaugh, Chiquita, PA-C  predniSONE  (DELTASONE ) 20 MG tablet 3 daily for 5 days, 2 daily for 5 days, 1 daily for 5 days. Patient not taking: Reported on 02/20/2015 11/30/12   Sonda Alm BROCKS, MD  pseudoephedrine  (SUDAFED) 60 MG tablet Take 1 tablet (60 mg total) by mouth every 6 (six) hours as needed for  congestion. Patient not taking: Reported on 02/20/2015 03/28/14   Remonia Chiquita, PA-C  QUEtiapine  (SEROQUEL ) 100 MG tablet TAKE 2 TABLETS(200 MG) BY MOUTH AT BEDTIME 01/31/24   Lorren Greig PARAS, NP  SEROQUEL  100 MG tablet Take 1 tablet (100 mg total) by mouth at bedtime. 08/24/23   Lorren Greig PARAS, NP  traZODone  (DESYREL ) 100 MG tablet Take 0.5-1 tablets (50-100 mg total) by mouth at bedtime as needed. 01/10/24   Lorren Greig PARAS, NP  triamcinolone  cream (KENALOG ) 0.1 % Apply topically 3 (three) times daily. Patient not taking: Reported on 02/20/2015 11/30/12   Sonda Alm BROCKS, MD    Family History Family History  Problem Relation Age of Onset   Diabetes Paternal Grandmother    Breast cancer Paternal Grandmother    Diabetes Father    Stroke Mother    Endometriosis Mother    Cervical cancer Mother    Cancer Other     Social History Social History   Tobacco Use   Smoking status: Never   Smokeless tobacco: Never  Vaping Use   Vaping status: Never Used  Substance Use Topics   Alcohol use: Yes   Drug use: Not Currently    Types: Marijuana     Allergies   Calamine   Review of Systems Review of Systems  Constitutional:  Negative for chills and fever.  Gastrointestinal:  Positive for abdominal pain, diarrhea, nausea and vomiting.     Physical Exam Triage Vital Signs ED Triage Vitals  Encounter Vitals Group     BP 02/18/24 1517 108/69     Girls Systolic BP Percentile --      Girls Diastolic BP Percentile --      Boys Systolic BP Percentile --      Boys Diastolic BP Percentile --      Pulse Rate 02/18/24 1517 63     Resp 02/18/24 1517 16     Temp 02/18/24 1517 98.3 F (36.8 C)     Temp Source 02/18/24 1517 Oral     SpO2 02/18/24 1517 98 %     Weight 02/18/24 1517 130 lb (59 kg)     Height 02/18/24 1517 5' 5 (1.651 m)     Head Circumference --      Peak Flow --      Pain Score 02/18/24 1526 0     Pain Loc --      Pain Education --      Exclude from Growth Chart --     No data found.  Updated Vital Signs BP 108/69 (BP Location: Right Arm)   Pulse 63   Temp 98.3 F (36.8 C) (Oral)   Resp 16   Ht 5' 5 (1.651 m)   Wt 130 lb (59 kg)   LMP 02/10/2024 (Approximate)   SpO2 98%   BMI 21.63 kg/m   Visual Acuity Right Eye Distance:   Left Eye Distance:   Bilateral Distance:  Right Eye Near:   Left Eye Near:    Bilateral Near:     Physical Exam Vitals reviewed.  Constitutional:      General: She is awake. She is not in acute distress.    Appearance: Normal appearance. She is well-developed and well-groomed. She is not ill-appearing, toxic-appearing or diaphoretic.     Comments: Patient is seated comfortably in exam chair.  She does not appear to be in acute distress or acutely ill-appearing.  She does have an emesis bag and is spitting about every 30 to 60 seconds  HENT:     Head: Normocephalic and atraumatic.  Eyes:     General: Lids are normal. Gaze aligned appropriately.     Extraocular Movements: Extraocular movements intact.     Conjunctiva/sclera: Conjunctivae normal.  Cardiovascular:     Rate and Rhythm: Normal rate and regular rhythm.     Heart sounds: Normal heart sounds. No murmur heard.    No friction rub. No gallop.  Pulmonary:     Effort: Pulmonary effort is normal.     Breath sounds: Normal breath sounds. No decreased air movement. No decreased breath sounds, wheezing, rhonchi or rales.  Abdominal:     General: Abdomen is flat.     Palpations: Abdomen is soft.     Tenderness: There is generalized abdominal tenderness and tenderness in the epigastric area and left upper quadrant. There is no guarding. Negative signs include Murphy's sign and McBurney's sign.  Musculoskeletal:     Cervical back: Normal range of motion and neck supple.  Skin:    General: Skin is warm and dry.  Neurological:     Mental Status: She is alert and oriented to person, place, and time.  Psychiatric:        Attention and Perception: Attention  and perception normal.        Mood and Affect: Mood and affect normal.        Speech: Speech normal.        Behavior: Behavior normal. Behavior is cooperative.      UC Treatments / Results  Labs (all labs ordered are listed, but only abnormal results are displayed) Labs Reviewed  POCT URINE PREGNANCY - Normal  LIPASE  COMPREHENSIVE METABOLIC PANEL WITH GFR  CBC WITH DIFFERENTIAL/PLATELET    EKG   Radiology No results found.  Procedures Procedures (including critical care time)  Medications Ordered in UC Medications  ondansetron  (ZOFRAN -ODT) disintegrating tablet 4 mg (4 mg Oral Given 02/18/24 1529)    Initial Impression / Assessment and Plan / UC Course  I have reviewed the triage vital signs and the nursing notes.  Pertinent labs & imaging results that were available during my care of the patient were reviewed by me and considered in my medical decision making (see chart for details).      Final Clinical Impressions(s) / UC Diagnoses   Final diagnoses:  Nausea and vomiting, unspecified vomiting type  Colicky LUQ abdominal pain   LUQ pain  Suspected acute pancreatitis with left upper quadrant pain, nausea, and diarrhea. Pain is moderate, rated 6/10, and improves with positional changes. No recent aggravating factors. Previous pancreatitis episodes- most recent one in May 2025 per chart review. Differential includes pancreatitis given symptoms and history but cannot rule out biliary colic, GERD, gastroenteritis.  Discussed that we can run labs here in clinic but she would likely receive quicker results and faster intervention in the emergency room.  Patient is hesitant to go so we discussed ER  precautions as well as when to call 911 for assistance.  Patient voices agreement and understanding with recommendations - Order CBC, CMP, Lipase  to assess for pancreatitis. Results to dictate further management  - Advise bowel rest with clear liquids until symptoms improve. -  Recommend Acetaminophen  and  ibuprofen  for pain control as needed. - Provide Zofran  refill for nausea control. - Instruct to go to the emergency room if symptoms worsen or if unable to manage at home.  Dehydration Concern for dehydration due to inability to eat or drink and ongoing diarrhea. Discussed benefits of IV fluids for quicker rehydration at the emergency room. - Advise to monitor for signs of dehydration and go to the emergency room if symptoms worsen.  Nausea and vomiting Nausea and vomiting present, partially controlled with Zofran . No recent intake of food or fluids due to symptoms. Emphasized importance of managing nausea to prevent further dehydration. - Provide Zofran  refill for nausea control. - Advise to monitor symptoms and seek emergency care if nausea and vomiting worsen.  Diarrhea Diarrhea with light brown stools. No blood present. Contributing to dehydration and inability to maintain oral intake. - Advise bowel rest with clear liquids until symptoms improve. - Monitor for worsening symptoms and seek emergency care if necessary.    Discharge Instructions      VISIT SUMMARY:  You came in today with diarrhea and abdominal pain. Your diarrhea started today and is characterized by light brown stools without blood. You also have left upper quadrant abdominal pain rated at 6 out of 10, which is less severe than your previous pancreatitis episodes. You have not been able to eat or drink much today and vomited after consuming a yogurt smoothie. Zofran  has helped reduce your nausea. You have a history of pancreatitis, with the last flare occurring approximately a year ago to your knowledge.  YOUR PLAN:  -SUSPECTED ACUTE PANCREATITIS: Acute pancreatitis is an inflammation of the pancreas that can cause abdominal pain, nausea, and diarrhea. We will order blood tests (CBC and electrolytes, lipase) to assess for pancreatitis. You should rest your bowel by consuming only clear  liquids until your symptoms improve. Ibuprofen  and Acetaminophen  recommended for pain control as needed, and a refill of Zofran  is provided for nausea control. If your symptoms worsen or you are unable to manage them at home, please go to the emergency room.  -DEHYDRATION: Dehydration occurs when your body loses more fluids than it takes in, which can happen due to diarrhea and vomiting. We discussed the benefits of IV fluids for quicker rehydration at the emergency room. Please monitor for signs of dehydration and go to the emergency room if your symptoms worsen.  -NAUSEA AND VOMITING: Nausea and vomiting can prevent you from eating or drinking, leading to dehydration. Your nausea is partially controlled with Zofran . It is important to manage your nausea to prevent further dehydration. A refill of Zofran  is provided for nausea control. Please monitor your symptoms and seek emergency care if they worsen.  -DIARRHEA: Diarrhea is the frequent passage of loose, watery stools. Your diarrhea is light brown and clay-like without blood, contributing to dehydration and difficulty maintaining oral intake. You should rest your bowel by consuming only clear liquids until your symptoms improve. Monitor for worsening symptoms and seek emergency care if necessary.  INSTRUCTIONS:  Please follow up with the recommended blood tests to assess for pancreatitis. Rest your bowel by consuming only clear liquids until your symptoms improve. Use ibuprofen  for pain control as needed and  take Zofran  for nausea as prescribed. Monitor for signs of dehydration and worsening symptoms, and go to the emergency room if necessary: Fever that is not responding to Tylenol  ibuprofen , severe abdominal pain, nausea and vomiting preventing you from eating or drinking at all, confusion, loss of consciousness     ED Prescriptions     Medication Sig Dispense Auth. Provider   ondansetron  (ZOFRAN -ODT) 4 MG disintegrating tablet Take 1 tablet  (4 mg total) by mouth every 8 (eight) hours as needed for nausea or vomiting. 20 tablet Kingsley Herandez E, PA-C      PDMP not reviewed this encounter.   Marylene Rocky BRAVO, PA-C 02/18/24 [id number]

## 2024-02-18 NOTE — ED Triage Notes (Addendum)
 Pt presents with complaints of nausea, vomiting, and diarrhea today. States her symptoms started this morning, 9/7. Currently denies pain. Denies taking OTC medications for symptoms reported. Coworkers have been ill recently.  Has a spit bag in triage. States she feels nauseous at this time. LMP 02/10/2024.

## 2024-02-19 NOTE — Telephone Encounter (Signed)
 Quetiapine  prescribed (01/31/2024  3:42 PM EDT).

## 2024-02-20 ENCOUNTER — Ambulatory Visit (HOSPITAL_COMMUNITY): Payer: Self-pay

## 2024-02-20 LAB — COMPREHENSIVE METABOLIC PANEL WITH GFR
ALT: 32 IU/L (ref 0–32)
AST: 44 IU/L — ABNORMAL HIGH (ref 0–40)
Albumin: 5.1 g/dL — ABNORMAL HIGH (ref 4.0–5.0)
Alkaline Phosphatase: 52 IU/L (ref 44–121)
BUN/Creatinine Ratio: 10 (ref 9–23)
BUN: 7 mg/dL (ref 6–20)
Bilirubin Total: 0.4 mg/dL (ref 0.0–1.2)
CO2: 23 mmol/L (ref 20–29)
Calcium: 10.1 mg/dL (ref 8.7–10.2)
Chloride: 101 mmol/L (ref 96–106)
Creatinine, Ser: 0.72 mg/dL (ref 0.57–1.00)
Globulin, Total: 2.4 g/dL (ref 1.5–4.5)
Glucose: 94 mg/dL (ref 70–99)
Potassium: 4 mmol/L (ref 3.5–5.2)
Sodium: 142 mmol/L (ref 134–144)
Total Protein: 7.5 g/dL (ref 6.0–8.5)
eGFR: 116 mL/min/1.73 (ref >59)

## 2024-02-20 LAB — CBC WITH DIFFERENTIAL/PLATELET
Basophils Absolute: 0 x10E3/uL (ref 0.0–0.2)
Basos: 0 %
EOS (ABSOLUTE): 0 x10E3/uL (ref 0.0–0.4)
Eos: 0 %
Hematocrit: 38.7 % (ref 34.0–46.6)
Hemoglobin: 12.8 g/dL (ref 11.1–15.9)
Immature Grans (Abs): 0 x10E3/uL (ref 0.0–0.1)
Immature Granulocytes: 0 %
Lymphocytes Absolute: 1 x10E3/uL (ref 0.7–3.1)
Lymphs: 23 %
MCH: 35.8 pg — ABNORMAL HIGH (ref 26.6–33.0)
MCHC: 33.1 g/dL (ref 31.5–35.7)
MCV: 108 fL — ABNORMAL HIGH (ref 79–97)
Monocytes Absolute: 0.4 x10E3/uL (ref 0.1–0.9)
Monocytes: 10 %
Neutrophils Absolute: 3 x10E3/uL (ref 1.4–7.0)
Neutrophils: 67 %
Platelets: 324 x10E3/uL (ref 150–450)
RBC: 3.58 x10E6/uL — ABNORMAL LOW (ref 3.77–5.28)
RDW: 13.1 % (ref 11.7–15.4)
WBC: 4.5 x10E3/uL (ref 3.4–10.8)

## 2024-02-20 LAB — LIPASE: Lipase: 10 U/L — ABNORMAL LOW (ref 14–72)

## 2024-02-20 NOTE — Telephone Encounter (Signed)
 Quetiapine  prescribed (01/31/2024  3:42 PM EDT).

## 2024-03-28 ENCOUNTER — Encounter: Payer: Self-pay | Admitting: Emergency Medicine

## 2024-03-28 ENCOUNTER — Ambulatory Visit: Admission: EM | Admit: 2024-03-28 | Discharge: 2024-03-28 | Disposition: A | Discharge status: Home / Self Care

## 2024-03-28 DIAGNOSIS — K2900 Acute gastritis without bleeding: Secondary | ICD-10-CM | POA: Diagnosis not present

## 2024-03-28 DIAGNOSIS — R112 Nausea with vomiting, unspecified: Secondary | ICD-10-CM | POA: Diagnosis not present

## 2024-03-28 LAB — POCT URINE PREGNANCY: Preg Test, Ur: NEGATIVE

## 2024-03-28 MED ORDER — ONDANSETRON 4 MG PO TBDP
4.0000 mg | ORAL_TABLET | Freq: Once | ORAL | Status: AC
  Administered 2024-03-28: 4 mg via ORAL

## 2024-03-28 MED ORDER — ONDANSETRON 4 MG PO TBDP: 4.0000 mg | ORAL_TABLET | Freq: Three times a day (TID) | ORAL | 0 refills | Status: AC | PRN

## 2024-03-28 NOTE — ED Triage Notes (Signed)
 Pt c/o nausea and vomiting since 9 am this morning. States she has thrown up 4-5 times. Denies any diarrhea.  She has hx of pancreatitis

## 2024-03-28 NOTE — Discharge Instructions (Addendum)
  1. Acute gastritis without hemorrhage, unspecified gastritis type (Primary) 2. Nausea and vomiting, unspecified vomiting type - POCT urine pregnancy completed in UC is negative - ondansetron  (ZOFRAN -ODT) disintegrating tablet 4 mg given in UC for acute nausea and vomiting - ondansetron  (ZOFRAN -ODT) 4 MG disintegrating tablet; Take 1 tablet (4 mg total) by mouth every 8 (eight) hours as needed for nausea or vomiting.  Dispense: 20 tablet; Refill: 0 - Due to continued nausea and vomiting despite ODT Zofran  in UC recommend further evaluation and treatment in emergency room for potential causes of intractable nausea and vomiting. - Please go to emergency department for further evaluation and treatment after leaving urgent care.

## 2024-03-28 NOTE — ED Provider Notes (Signed)
 [facility]  Note:  This document was prepared using Dragon voice recognition software and may include unintentional dictation errors.  MRN: [id number] DOB: [birth year]  Subjective:   Mariah Bradley is a 28 y.o. female presenting for intractable nausea and vomiting since this morning at 9 AM.  Patient reports that she has thrown up 4 or 5 times since onset this morning.  Patient denies any diarrhea, severe abdominal pain, flank pain, weakness, dizziness.  Patient has past history of pancreatitis but does not believe this pancreatitis due to lack of abdominal pain.  Patient denies any fever, shortness of breath, chest pain.  Patient denies any change in diet, no exposure to anyone else with similar GI symptoms.  No current facility-administered medications for this encounter.  Current Outpatient Medications:    ondansetron  (ZOFRAN -ODT) 4 MG disintegrating tablet, Take 1 tablet (4 mg total) by mouth every 8 (eight) hours as needed for nausea or vomiting., Disp: 20 tablet, Rfl: 0   busPIRone  (BUSPAR ) 10 MG tablet, Take 1 tablet (10 mg total) by mouth 3 (three) times daily. When able to remember, Disp: 270 tablet, Rfl: 3   cyclobenzaprine  (FLEXERIL ) 10 MG tablet, Take 1 tablet (10 mg total) by mouth 2 (two) times daily as needed for muscle spasms. (Patient not taking: Reported on 09/13/2023), Disp: 20 tablet, Rfl: 0   hydrOXYzine  (ATARAX /VISTARIL ) 25 MG tablet, Take 1 tablet (25 mg total) by mouth every 6 (six) hours as needed for itching. (Patient not taking: Reported on 02/20/2015), Disp: 30 tablet, Rfl: 0   hydrOXYzine  (VISTARIL ) 50 MG capsule, Take 50 mg by mouth at bedtime as needed (sleep). (Patient not taking: Reported on 09/13/2023), Disp: , Rfl:    ibuprofen  (ADVIL ,MOTRIN ) 600 MG tablet, Take 1 tablet (600 mg total) by mouth every 6 (six) hours as needed. (Patient not taking: Reported on 09/13/2023), Disp: 30 tablet, Rfl: 0   methocarbamol  (ROBAXIN ) 500 MG tablet, Take 2  tablets (1,000 mg total) by mouth every 8 (eight) hours as needed for muscle spasms. (Patient not taking: Reported on 09/13/2023), Disp: 30 tablet, Rfl: 0   ondansetron  (ZOFRAN ) 4 MG tablet, Take 1 tablet (4 mg total) by mouth every 6 (six) hours. (Patient not taking: Reported on 09/13/2023), Disp: 12 tablet, Rfl: 0   oxyCODONE -acetaminophen  (PERCOCET/ROXICET) 5-325 MG tablet, Take 1-2 tablets by mouth every 8 (eight) hours as needed for severe pain (pain score 7-10)., Disp: 10 tablet, Rfl: 0   pantoprazole  (PROTONIX ) 20 MG tablet, Take 1 tablet (20 mg total) by mouth daily. (Patient not taking: Reported on 09/13/2023), Disp: 30 tablet, Rfl: 0   predniSONE  (DELTASONE ) 20 MG tablet, 3 daily for 5 days, 2 daily for 5 days, 1 daily for 5 days. (Patient not taking: Reported on 02/20/2015), Disp: 30 tablet, Rfl: 0   pseudoephedrine  (SUDAFED) 60 MG tablet, Take 1 tablet (60 mg total) by mouth every 6 (six) hours as needed for congestion. (Patient not taking: Reported on 02/20/2015), Disp: 30 tablet, Rfl: 0   QUEtiapine  (SEROQUEL ) 100 MG tablet, TAKE 2 TABLETS(200 MG) BY MOUTH AT BEDTIME, Disp: 60 tablet, Rfl: 2   SEROQUEL  100 MG tablet, Take 1 tablet (100 mg total) by mouth at bedtime., Disp: 30 tablet, Rfl: 1   traZODone  (DESYREL ) 100 MG tablet, Take 0.5-1 tablets (50-100 mg total) by mouth at bedtime as needed., Disp: 30 tablet, Rfl: 1   triamcinolone  cream (KENALOG ) 0.1 %, Apply topically 3 (three) times daily. (Patient not taking: Reported on 02/20/2015), Disp: 454 g,  Rfl: 0   Allergies  Allergen Reactions   Calamine     unsure    Past Medical History:  Diagnosis Date   Anxiety    Depression    PTSD (post-traumatic stress disorder)      Past Surgical History:  Procedure Laterality Date   TONSILLECTOMY      Family History  Problem Relation Age of Onset   Diabetes Paternal Grandmother    Breast cancer Paternal Grandmother    Diabetes Father    Stroke Mother    Endometriosis Mother    Cervical  cancer Mother    Cancer Other     Social History   Tobacco Use   Smoking status: Never   Smokeless tobacco: Never  Vaping Use   Vaping status: Never Used  Substance Use Topics   Alcohol use: Yes   Drug use: Not Currently    Types: Marijuana    ROS Refer to HPI for ROS details.  Objective:   Vitals: BP 119/78 (BP Location: Right Arm)   Pulse 88   Temp 99.2 F (37.3 C) (Temporal)   Resp 16   LMP 03/21/2024   SpO2 96%   Physical Exam Vitals and nursing note reviewed.  Constitutional:      General: She is not in acute distress.    Appearance: Normal appearance. She is well-developed. She is not ill-appearing or toxic-appearing.  HENT:     Head: Normocephalic and atraumatic.     Mouth/Throat:     Mouth: Mucous membranes are moist.  Cardiovascular:     Rate and Rhythm: Normal rate.  Pulmonary:     Effort: Pulmonary effort is normal. No respiratory distress.  Abdominal:     Palpations: Abdomen is soft.     Tenderness: There is no abdominal tenderness. There is no right CVA tenderness or left CVA tenderness.  Skin:    General: Skin is warm and dry.  Neurological:     General: No focal deficit present.     Mental Status: She is alert and oriented to person, place, and time.  Psychiatric:        Mood and Affect: Mood normal.        Behavior: Behavior normal.     Procedures  Results for orders placed or performed during the hospital encounter of 03/28/24 (from the past 24 hours)  POCT urine pregnancy     Status: None   Collection Time: 03/28/24  3:00 PM  Result Value Ref Range   Preg Test, Ur Negative Negative    No results found.   Assessment and Plan :     Discharge Instructions       1. Acute gastritis without hemorrhage, unspecified gastritis type (Primary) 2. Nausea and vomiting, unspecified vomiting type - POCT urine pregnancy completed in UC is negative - ondansetron  (ZOFRAN -ODT) disintegrating tablet 4 mg given in UC for acute nausea and  vomiting - ondansetron  (ZOFRAN -ODT) 4 MG disintegrating tablet; Take 1 tablet (4 mg total) by mouth every 8 (eight) hours as needed for nausea or vomiting.  Dispense: 20 tablet; Refill: 0 - Due to continued nausea and vomiting despite ODT Zofran  in UC recommend further evaluation and treatment in emergency room for potential causes of intractable nausea and vomiting. - Please go to emergency department for further evaluation and treatment after leaving urgent care.      Arias Weinert B Penda Venturi   Nevelyn Mellott, [place], [place] 03/28/24 1514

## 2024-05-30 ENCOUNTER — Other Ambulatory Visit: Payer: Self-pay | Admitting: Family

## 2024-05-30 NOTE — Telephone Encounter (Unsigned)
 Copied from CRM #[id number]. Topic: Clinical - Medication Refill >> May 30, 2024  4:43 PM Joesph B wrote: Medication:  busPIRone  (BUSPAR ) 10 MG tablet    Has the patient contacted their pharmacy? Yes (Agent: If no, request that the patient contact the pharmacy for the refill. If patient does not wish to contact the pharmacy document the reason why and proceed with request.) (Agent: If yes, when and what did the pharmacy advise?)  This is the patient's preferred pharmacy:  [id number] [place], [place] - [address] AT [facility] OF [facility] & [address] [place] [address] Phone: [phone] Fax: [phone]  Is this the correct pharmacy for this prescription? Yes If no, delete pharmacy and type the correct one.   Has the prescription been filled recently? Yes  Is the patient out of the medication? Yes  Has the patient been seen for an appointment in the last year OR does the patient have an upcoming appointment? Yes  Can we respond through MyChart? Yes  Agent: Please be advised that Rx refills may take up to 3 business days. We ask that you follow-up with your pharmacy.

## 2024-06-04 NOTE — Telephone Encounter (Signed)
 Too soon for refill.  Requested Prescriptions  Pending Prescriptions Disp Refills   busPIRone  (BUSPAR ) 10 MG tablet 270 tablet 3    Sig: Take 1 tablet (10 mg total) by mouth 3 (three) times daily. When able to remember     Psychiatry: Anxiolytics/Hypnotics - Non-controlled Passed - 06/04/2024  9:23 AM      Passed - Valid encounter within last 12 months    Recent Outpatient Visits           8 months ago Annual physical exam   [facility], Amy J, NP   9 months ago Encounter to establish care   [facility], Greig PARAS, NP

## 2024-06-05 ENCOUNTER — Other Ambulatory Visit: Payer: Self-pay | Admitting: Emergency Medicine

## 2024-06-05 ENCOUNTER — Telehealth: Payer: Self-pay | Admitting: Emergency Medicine

## 2024-06-05 DIAGNOSIS — F32A Depression, unspecified: Secondary | ICD-10-CM

## 2024-06-05 DIAGNOSIS — F4310 Post-traumatic stress disorder, unspecified: Secondary | ICD-10-CM

## 2024-06-05 DIAGNOSIS — G4700 Insomnia, unspecified: Secondary | ICD-10-CM

## 2024-06-05 MED ORDER — QUETIAPINE FUMARATE 100 MG PO TABS: 200.0000 mg | ORAL_TABLET | Freq: Every day | ORAL | 0 refills | Status: AC

## 2024-07-11 ENCOUNTER — Encounter: Payer: Self-pay | Admitting: Family

## 2024-09-12 ENCOUNTER — Encounter: Admitting: Family

## 2024-09-13 ENCOUNTER — Encounter: Admitting: Family
